# Patient Record
Sex: Male | Born: 1935 | Race: White | Hispanic: No | Smoking: Never smoker
Health system: Southern US, Community
[De-identification: ages and names within clinical notes are randomized; demographics above are authoritative.]

## PROBLEM LIST (undated history)

## (undated) DIAGNOSIS — I4891 Unspecified atrial fibrillation: Secondary | ICD-10-CM

## (undated) DIAGNOSIS — G629 Polyneuropathy, unspecified: Secondary | ICD-10-CM

## (undated) DIAGNOSIS — I509 Heart failure, unspecified: Secondary | ICD-10-CM

## (undated) HISTORY — PX: EYE SURGERY: SHX253

## (undated) HISTORY — PX: APPENDECTOMY: SHX54

## (undated) HISTORY — PX: BACK SURGERY: SHX140

## (undated) HISTORY — PX: PROSTATE SURGERY: SHX751

## (undated) HISTORY — PX: NASAL RECONSTRUCTION: SHX2069

---

## 2012-12-07 ENCOUNTER — Emergency Department (HOSPITAL_BASED_OUTPATIENT_CLINIC_OR_DEPARTMENT_OTHER): Payer: Medicare Other

## 2012-12-07 ENCOUNTER — Emergency Department (HOSPITAL_BASED_OUTPATIENT_CLINIC_OR_DEPARTMENT_OTHER)
Admission: EM | Admit: 2012-12-07 | Discharge: 2012-12-07 | Disposition: A | Discharge status: Home / Self Care | Payer: Medicare Other | Attending: Emergency Medicine | Admitting: Emergency Medicine

## 2012-12-07 ENCOUNTER — Encounter (HOSPITAL_BASED_OUTPATIENT_CLINIC_OR_DEPARTMENT_OTHER): Payer: Self-pay | Admitting: *Deleted

## 2012-12-07 DIAGNOSIS — W010XXA Fall on same level from slipping, tripping and stumbling without subsequent striking against object, initial encounter: Secondary | ICD-10-CM | POA: Insufficient documentation

## 2012-12-07 DIAGNOSIS — S46909A Unspecified injury of unspecified muscle, fascia and tendon at shoulder and upper arm level, unspecified arm, initial encounter: Secondary | ICD-10-CM | POA: Insufficient documentation

## 2012-12-07 DIAGNOSIS — S5010XA Contusion of unspecified forearm, initial encounter: Secondary | ICD-10-CM | POA: Insufficient documentation

## 2012-12-07 DIAGNOSIS — Z79899 Other long term (current) drug therapy: Secondary | ICD-10-CM | POA: Insufficient documentation

## 2012-12-07 DIAGNOSIS — Y9389 Activity, other specified: Secondary | ICD-10-CM | POA: Insufficient documentation

## 2012-12-07 DIAGNOSIS — T07XXXA Unspecified multiple injuries, initial encounter: Secondary | ICD-10-CM

## 2012-12-07 DIAGNOSIS — Z7982 Long term (current) use of aspirin: Secondary | ICD-10-CM | POA: Insufficient documentation

## 2012-12-07 DIAGNOSIS — S5000XA Contusion of unspecified elbow, initial encounter: Secondary | ICD-10-CM | POA: Insufficient documentation

## 2012-12-07 DIAGNOSIS — I4891 Unspecified atrial fibrillation: Secondary | ICD-10-CM | POA: Insufficient documentation

## 2012-12-07 DIAGNOSIS — Z8669 Personal history of other diseases of the nervous system and sense organs: Secondary | ICD-10-CM | POA: Insufficient documentation

## 2012-12-07 DIAGNOSIS — S4980XA Other specified injuries of shoulder and upper arm, unspecified arm, initial encounter: Secondary | ICD-10-CM | POA: Insufficient documentation

## 2012-12-07 DIAGNOSIS — W19XXXA Unspecified fall, initial encounter: Secondary | ICD-10-CM

## 2012-12-07 DIAGNOSIS — Y929 Unspecified place or not applicable: Secondary | ICD-10-CM | POA: Insufficient documentation

## 2012-12-07 DIAGNOSIS — S41111A Laceration without foreign body of right upper arm, initial encounter: Secondary | ICD-10-CM

## 2012-12-07 DIAGNOSIS — S8990XA Unspecified injury of unspecified lower leg, initial encounter: Secondary | ICD-10-CM | POA: Insufficient documentation

## 2012-12-07 HISTORY — DX: Unspecified atrial fibrillation: I48.91

## 2012-12-07 MED ORDER — BACITRACIN 500 UNIT/GM EX OINT
1.0000 "application " | TOPICAL_OINTMENT | Freq: Once | CUTANEOUS | Status: AC
  Administered 2012-12-07: 1 via TOPICAL
  Filled 2012-12-07: qty 0.9

## 2012-12-07 NOTE — ED Provider Notes (Signed)
TIME SEEN: 12:11 PM  CHIEF COMPLAINT: Fall, arm pain, foot pain  HPI: Patient is a 77 y.o. male with a history of atrial fibrillation only on aspirin and peripheral neuropathy presents the emergency department for evaluation for right arm and right foot pain and after a fall last night at approximately 10 PM. Patient reports that due to his neuropathy he has difficulty at times walking. He reports that he stubbed his right second toe and it caused him to stumble and he fell into the TV tray hitting his right arm. He landed on the right side of his body. He did not hit his head. No loss of consciousness. Denies any chest pain, shortness of breath, palpitations or dizziness that caused him to fall. No recent infectious symptoms. He is not on any other anticoagulation other than aspirin. Denies any numbness, tingling or focal weakness. He reports his last tetanus vaccination was in the past 5 years.  ROS: See HPI Constitutional: no fever  Eyes: no drainage  ENT: no runny nose   Cardiovascular:  no chest pain  Resp: no SOB  GI: no vomiting GU: no dysuria Integumentary: no rash  Allergy: no hives  Musculoskeletal: no leg swelling  Neurological: no slurred speech ROS otherwise negative  PAST MEDICAL HISTORY/PAST SURGICAL HISTORY:  Past Medical History  Diagnosis Date  . A-fib     MEDICATIONS:  Prior to Admission medications   Medication Sig Start Date End Date Taking? Authorizing Provider  acetaminophen (TYLENOL) 500 MG tablet Take 500 mg by mouth 2 (two) times daily.   Yes Historical Provider, MD  aspirin 325 MG tablet Take 325 mg by mouth daily.   Yes Historical Provider, MD  ibuprofen (ADVIL,MOTRIN) 600 MG tablet Take 600 mg by mouth every 8 (eight) hours as needed for pain.   Yes Historical Provider, MD  sertraline (ZOLOFT) 50 MG tablet Take 50 mg by mouth daily.   Yes Historical Provider, MD    ALLERGIES:  No Known Allergies  SOCIAL HISTORY:  History  Substance Use Topics  .  Smoking status: Never Smoker   . Smokeless tobacco: Not on file  . Alcohol Use: 1.2 oz/week    2 Glasses of wine per week     Comment: daily    FAMILY HISTORY: No family history on file.  EXAM: BP 144/64  Temp(Src) 97.8 F (36.6 C) (Oral)  Resp 20  Ht 5\' 11"  (1.803 m)  Wt 155 lb (70.308 kg)  BMI 21.63 kg/m2  SpO2 100% CONSTITUTIONAL: Alert and oriented and responds appropriately to questions. Well-appearing; well-nourished; GCS 15 HEAD: Normocephalic; atraumatic EYES: Conjunctivae clear, PERRL, EOMI ENT: normal nose; no rhinorrhea; moist mucous membranes; pharynx without lesions noted; no dental injury; no septal hematoma NECK: Supple, no meningismus, no LAD; no midline spinal tenderness, step-off or deformity CARD: RRR; S1 and S2 appreciated; no murmurs, no clicks, no rubs, no gallops RESP: Normal chest excursion without splinting or tachypnea; breath sounds clear and equal bilaterally; no wheezes, no rhonchi, no rales; chest wall stable, nontender to palpation ABD/GI: Normal bowel sounds; non-distended; soft, non-tender, no rebound, no guarding PELVIS:  stable, nontender to palpation BACK:  The back appears normal and is non-tender to palpation, there is no CVA tenderness; no midline spinal tenderness, step-off or deformity EXT: Ecchymosis to the right forearm and right elbow with no joint effusion, no bony tenderness or deformity, full range of motion in his shoulder, elbow, wrist on the right side, 2+ radial pulses bilaterally, neurovascularly intact distally, she  also has a 1 cm lesion to his second right toe that he reports his for several weeks, he also has mild swelling and bruising to his first and second toe of his right foot from his fall last night, 2+ DP pulses bilaterally, sensation to light touch intact diffusely, Normal ROM in all joints; otherwise non-tender to palpation; no edema; normal capillary refill; no cyanosis    SKIN: Normal color for age and race; warm,  multiple skin tears to his right forearm NEURO: Moves all extremities equally, sensation to light touch intact diffusely, cranial nerves II through XII intact, normal gait PSYCH: The patient's mood and manner are appropriate. Grooming and personal hygiene are appropriate.  MEDICAL DECISION MAKING: Patient with mechanical fall last night with right arm injury and right foot injury. Will obtain x-rays. Given his lacerations were sustained greater than 12 hours ago, I do not feel he needs repair at this time as the risk of infection is high. Discussed this with patient and wife who agree. Will clean wound, apply bacitracin and placed sterile dressing.  ED PROGRESS: X-rays show no acute abnormality. Patient has degenerative changes. Wound has been cleaned and dressed. Given instructions for supportive care, wound care, return precautions. Patient and wife verbalize understanding and are comfortable with plan.     [place], DO 12/07/12 1333

## 2012-12-07 NOTE — ED Notes (Signed)
Patient states last night at 2200 he fell while getting up from a chair.  Wife states he had to be assisted up from the floor by EMS.  States he has a skin tear to his right forearm,

## 2015-11-12 ENCOUNTER — Encounter (HOSPITAL_BASED_OUTPATIENT_CLINIC_OR_DEPARTMENT_OTHER): Payer: Self-pay | Admitting: *Deleted

## 2015-11-12 ENCOUNTER — Emergency Department (HOSPITAL_BASED_OUTPATIENT_CLINIC_OR_DEPARTMENT_OTHER)
Admission: EM | Admit: 2015-11-12 | Discharge: 2015-11-12 | Disposition: A | Discharge status: Home / Self Care | Payer: Medicare HMO | Attending: Emergency Medicine | Admitting: Emergency Medicine

## 2015-11-12 DIAGNOSIS — Y999 Unspecified external cause status: Secondary | ICD-10-CM | POA: Insufficient documentation

## 2015-11-12 DIAGNOSIS — Y9301 Activity, walking, marching and hiking: Secondary | ICD-10-CM | POA: Diagnosis not present

## 2015-11-12 DIAGNOSIS — W1809XA Striking against other object with subsequent fall, initial encounter: Secondary | ICD-10-CM | POA: Insufficient documentation

## 2015-11-12 DIAGNOSIS — S81812A Laceration without foreign body, left lower leg, initial encounter: Secondary | ICD-10-CM

## 2015-11-12 DIAGNOSIS — Y929 Unspecified place or not applicable: Secondary | ICD-10-CM | POA: Insufficient documentation

## 2015-11-12 HISTORY — DX: Polyneuropathy, unspecified: G62.9

## 2015-11-12 MED ORDER — LIDOCAINE-EPINEPHRINE 2 %-1:100000 IJ SOLN
10.0000 mL | Freq: Once | INTRAMUSCULAR | Status: AC
  Administered 2015-11-12: 10 mL
  Filled 2015-11-12: qty 1

## 2015-11-12 NOTE — ED Triage Notes (Signed)
Per EMS:  Pt from the gym.  States that he fell off the back of the treadmill.  Denies hitting head or LOC.  Laceration to left shin.  Bleeding controlled with dressing from EMS

## 2015-11-12 NOTE — ED Provider Notes (Signed)
[facility] Provider Note   CSN: [id number] Arrival date & time: 11/12/15  1232     History   Chief Complaint Chief Complaint  Patient presents with  . Fall    HPI Gabriel Ayala is a 80 y.o. male.  The history is provided by the patient.  Laceration   The incident occurred 1 to 2 hours ago. The laceration is located on the left leg. The laceration is 6 cm in size. The laceration mechanism was a a blunt object (on the treadmill and increased the speed causing him to fall and hit his leg). The pain is at a severity of 0/10. The patient is experiencing no pain. He reports no foreign bodies present. His tetanus status is UTD.    Past Medical History:  Diagnosis Date  . A-fib ([place])   . Neuropathy ([place])     There are no active problems to display for this patient.   Past Surgical History:  Procedure Laterality Date  . APPENDECTOMY    . BACK SURGERY    . EYE SURGERY     detached retina  . NASAL RECONSTRUCTION    . PROSTATE SURGERY         Home Medications    Prior to Admission medications   Medication Sig Start Date End Date Taking? Authorizing Provider  acetaminophen (TYLENOL) 500 MG tablet Take 500 mg by mouth 2 (two) times daily.    Historical Provider, MD  aspirin 325 MG tablet Take 325 mg by mouth daily.    Historical Provider, MD  sertraline (ZOLOFT) 50 MG tablet Take 50 mg by mouth daily.    Historical Provider, MD    Family History History reviewed. No pertinent family history.  Social History Social History  Substance Use Topics  . Smoking status: Never Smoker  . Smokeless tobacco: Not on file  . Alcohol use 1.2 oz/week    2 Glasses of wine per week     Comment: daily     Allergies   Review of patient's allergies indicates no known allergies.   Review of Systems Review of Systems  All other systems reviewed and are negative.    Physical Exam Updated Vital Signs BP 142/62 (BP Location: Left Arm)   Pulse 69   Temp 98.1  F (36.7 C) (Oral)   Resp 18   Ht 5\' 11"  (1.803 m)   Wt 160 lb (72.6 kg)   SpO2 99%   BMI 22.32 kg/m   Physical Exam  Constitutional: He appears well-developed and well-nourished. No distress.  HENT:  Head: Normocephalic and atraumatic.  Eyes: EOM are normal. Pupils are equal, round, and reactive to light.  Cardiovascular: Normal rate.   Pulmonary/Chest: Effort normal.  Musculoskeletal:       Legs: Skin: Skin is warm and dry. Capillary refill takes less than 2 seconds.  Psychiatric: He has a normal mood and affect. His behavior is normal.     ED Treatments / Results  Labs (all labs ordered are listed, but only abnormal results are displayed) Labs Reviewed - No data to display  EKG  EKG Interpretation None       Radiology No results found.  Procedures Procedures (including critical care time)  Medications Ordered in ED Medications  lidocaine-EPINEPHrine (XYLOCAINE W/EPI) 2 %-1:100000 (with pres) injection 10 mL (10 mLs Infiltration Given 11/12/15 1258)     Initial Impression / Assessment and Plan / ED Course  I have reviewed the triage vital signs and the nursing notes.  Pertinent labs & imaging results that were available during my care of the patient were reviewed by me and considered in my medical decision making (see chart for details).  Clinical Course    Patient is a 80 year old male who had a mechanical fall while walking on the treadmill today when he increased the speed to much. Laceration to the left shin without other complicating features. Patient denies a complete fall or hitting his head with loss of consciousness. Tetanus shot is up-to-date. Wound repaired as below.  LACERATION REPAIR Performed by: Blanchie Dessert Authorized byBlanchie Dessert Consent: Verbal consent obtained. Risks and benefits: risks, benefits and alternatives were discussed Consent given by: patient Patient identity confirmed: provided demographic data Prepped and  Draped in normal sterile fashion Wound explored  Laceration Location: left shin  Laceration Length: 6cm  No Foreign Bodies seen or palpated  Anesthesia: local infiltration  Local anesthetic: lidocaine 2% with epinephrine  Anesthetic total: 4 ml  Irrigation method: syringe Amount of cleaning: standard  Skin closure: staples  Number of sutures: 7  Technique: staples  Patient tolerance: Patient tolerated the procedure well with no immediate complications.   Final Clinical Impressions(s) / ED Diagnoses   Final diagnoses:  Laceration of left lower leg, initial encounter    New Prescriptions Discharge Medication List as of 11/12/2015  1:46 PM       Blanchie Dessert, MD 11/12/15 1622

## 2015-11-12 NOTE — ED Triage Notes (Addendum)
Per EMS report: EMS transport from [facility]. Fell while walking on treadmill. Laceration present to left leg with dressing in place. No LOC. Pt cao x 4.

## 2016-01-31 ENCOUNTER — Encounter (HOSPITAL_BASED_OUTPATIENT_CLINIC_OR_DEPARTMENT_OTHER): Payer: Self-pay | Admitting: Adult Health

## 2016-01-31 ENCOUNTER — Emergency Department (HOSPITAL_BASED_OUTPATIENT_CLINIC_OR_DEPARTMENT_OTHER)
Admission: EM | Admit: 2016-01-31 | Discharge: 2016-01-31 | Disposition: A | Discharge status: Home / Self Care | Payer: Medicare HMO | Attending: Emergency Medicine | Admitting: Emergency Medicine

## 2016-01-31 DIAGNOSIS — Y929 Unspecified place or not applicable: Secondary | ICD-10-CM | POA: Insufficient documentation

## 2016-01-31 DIAGNOSIS — W5589XA Other contact with other mammals, initial encounter: Secondary | ICD-10-CM | POA: Diagnosis not present

## 2016-01-31 DIAGNOSIS — Y999 Unspecified external cause status: Secondary | ICD-10-CM | POA: Diagnosis not present

## 2016-01-31 DIAGNOSIS — Z7982 Long term (current) use of aspirin: Secondary | ICD-10-CM | POA: Insufficient documentation

## 2016-01-31 DIAGNOSIS — S81811A Laceration without foreign body, right lower leg, initial encounter: Secondary | ICD-10-CM | POA: Insufficient documentation

## 2016-01-31 DIAGNOSIS — Y939 Activity, unspecified: Secondary | ICD-10-CM | POA: Diagnosis not present

## 2016-01-31 MED ORDER — CEPHALEXIN 500 MG PO CAPS: 500.0000 mg | ORAL_CAPSULE | Freq: Two times a day (BID) | ORAL | 0 refills | Status: DC

## 2016-01-31 MED ORDER — BACITRACIN ZINC 500 UNIT/GM EX OINT
TOPICAL_OINTMENT | Freq: Two times a day (BID) | CUTANEOUS | Status: DC
  Administered 2016-01-31: 1 via TOPICAL

## 2016-01-31 MED ORDER — LIDOCAINE-EPINEPHRINE (PF) 2 %-1:200000 IJ SOLN: 10.0000 mL | Freq: Once | INTRAMUSCULAR | Status: DC

## 2016-01-31 MED ORDER — LIDOCAINE-EPINEPHRINE 1 %-1:200000 IJ SOLN
INTRAMUSCULAR | Status: AC
Start: 2016-01-31 — End: 2016-01-31
  Administered 2016-01-31: 30 mL
  Filled 2016-01-31: qty 30

## 2016-01-31 NOTE — Discharge Instructions (Signed)
Bacitracin or neosporin twice a day. Keep wound clean and dry. Cover with dry dressing. Take keflex to prevent infection. Keep close eye on signs of infection and follow up as needed. Follow up in 10-14 days for suture removal.

## 2016-01-31 NOTE — ED Triage Notes (Signed)
Presents with laceration to right lateral lower leg from a dog scratch. Bleeding slightly. CMS intact.

## 2016-01-31 NOTE — ED Notes (Signed)
Dressing applied and patient d/c'd home

## 2016-01-31 NOTE — ED Provider Notes (Signed)
[facility] Provider Note   CSN: [id number] Arrival date & time: 01/31/16  1242     History   Chief Complaint Chief Complaint  Patient presents with  . Laceration    HPI OZIL STETTLER is a 80 y.o. male.  HPI DONOVAN PERSLEY is a 80 y.o. male with history of A. fib and peripheral neuropathy, presents to emergency department with complaint of a laceration. Patient states that his dog jumped on him yesterday evening and he sustained a laceration to the right lower leg. Patient states that he apply dressing to the leg yesterday, but it has continued to bleed last night and all through the night into this morning so he decided to come have it looked at. Patient denies any fever or chills. He does not take any blood thinners. He denies any numbness or weakness distal to the injury. States tetanus is up to date  Past Medical History:  Diagnosis Date  . A-fib ([place])   . Neuropathy ([place])     There are no active problems to display for this patient.   Past Surgical History:  Procedure Laterality Date  . APPENDECTOMY    . BACK SURGERY    . EYE SURGERY     detached retina  . NASAL RECONSTRUCTION    . PROSTATE SURGERY         Home Medications    Prior to Admission medications   Medication Sig Start Date End Date Taking? Authorizing Provider  acetaminophen (TYLENOL) 500 MG tablet Take 500 mg by mouth 2 (two) times daily.    Historical Provider, MD  aspirin 325 MG tablet Take 325 mg by mouth daily.    Historical Provider, MD  sertraline (ZOLOFT) 50 MG tablet Take 50 mg by mouth daily.    Historical Provider, MD    Family History History reviewed. No pertinent family history.  Social History Social History  Substance Use Topics  . Smoking status: Never Smoker  . Smokeless tobacco: Not on file  . Alcohol use 1.2 oz/week    2 Glasses of wine per week     Comment: daily     Allergies   Patient has no known allergies.   Review of Systems Review of  Systems  Constitutional: Negative for chills and fever.  Skin: Positive for wound.  Neurological: Negative for weakness and numbness.     Physical Exam Updated Vital Signs BP 131/56 (BP Location: Right Arm)   Pulse 70   Temp 97.8 F (36.6 C) (Oral)   Resp 18   Ht 5\' 11"  (1.803 m)   Wt 77.1 kg   SpO2 99%   BMI 23.71 kg/m   Physical Exam  Constitutional: He is oriented to person, place, and time. He appears well-developed and well-nourished. No distress.  Eyes: Conjunctivae are normal.  Neck: Neck supple.  Cardiovascular: Normal rate.   Pulmonary/Chest: No respiratory distress.  Abdominal: He exhibits no distension.  Musculoskeletal:  8 cm deep  flap laceration to the right lower leg. There is active slow bleeding from superficial varicosities. DP pulses intact. Full ROM of the foot.  Neurological: He is alert and oriented to person, place, and time.  Skin: Skin is warm and dry. Capillary refill takes less than 2 seconds.  Nursing note and vitals reviewed.    ED Treatments / Results  Labs (all labs ordered are listed, but only abnormal results are displayed) Labs Reviewed - No data to display  EKG  EKG Interpretation None  Radiology No results found.  Procedures Procedures (including critical care time)  LACERATION REPAIR Performed by: Renold Genta Authorized by: Jeannett Senior A Consent: Verbal consent obtained. Risks and benefits: risks, benefits and alternatives were discussed Consent given by: patient Patient identity confirmed: provided demographic data Prepped and Draped in normal sterile fashion Wound explored  Laceration Location: right lower leg  Laceration Length: 8cm  No Foreign Bodies seen or palpated  Anesthesia: local infiltration  Local anesthetic: lidocaine 2% w epinephrine  Anesthetic total: 4 ml  Irrigation method: syringe Amount of cleaning: extensive  Skin closure: prolene 4.0  Number of sutures:  7  Technique: simple interrupted  Patient tolerance: Patient tolerated the procedure well with no immediate complications.  Medications Ordered in ED Medications  lidocaine-EPINEPHrine (XYLOCAINE W/EPI) 2 %-1:200000 (PF) injection 10 mL (not administered)  lidocaine-EPINEPHrine (XYLOCAINE-EPINEPHrine) 1 %-1:200000 (PF) injection (not administered)     Initial Impression / Assessment and Plan / ED Course  I have reviewed the triage vital signs and the nursing notes.  Pertinent labs & imaging results that were available during my care of the patient were reviewed by me and considered in my medical decision making (see chart for details).  Clinical Course    Patient with a wound to the right lower leg after a dog scratch yesterday. Here because he continues to bleed. Bleeding looks like coming from lacerated varicose vein. After numbing the wound, it was extensively irrigated. Wound is closely approximated with stitches. Bleeding stopped. His tetanus is up today. Will discharge home with Keflex for infection prophylaxis and follow-up.  Vitals:   01/31/16 1250 01/31/16 1252  BP: 131/56   Pulse: 70   Resp: 18   Temp: 97.8 F (36.6 C)   TempSrc: Oral   SpO2: 99%   Weight:  77.1 kg  Height:  5\' 11"  (1.803 m)     Final Clinical Impressions(s) / ED Diagnoses   Final diagnoses:  Laceration of right lower leg, initial encounter    New Prescriptions Discharge Medication List as of 01/31/2016  1:33 PM    START taking these medications   Details  cephALEXin (KEFLEX) 500 MG capsule Take 1 capsule (500 mg total) by mouth 2 (two) times daily., Starting Thu 01/31/2016, Print         Jeannett Senior, PA-C 01/31/16 [place], MD 02/01/16 [phone]

## 2017-08-24 ENCOUNTER — Ambulatory Visit: Payer: Medicare HMO | Admitting: Physical Therapy

## 2018-08-03 ENCOUNTER — Encounter: Payer: Self-pay | Admitting: Internal Medicine

## 2018-08-03 ENCOUNTER — Non-Acute Institutional Stay (SKILLED_NURSING_FACILITY): Payer: Medicare HMO | Admitting: Internal Medicine

## 2018-08-03 DIAGNOSIS — M4800 Spinal stenosis, site unspecified: Secondary | ICD-10-CM | POA: Diagnosis not present

## 2018-08-03 DIAGNOSIS — I480 Paroxysmal atrial fibrillation: Secondary | ICD-10-CM

## 2018-08-03 DIAGNOSIS — G63 Polyneuropathy in diseases classified elsewhere: Secondary | ICD-10-CM

## 2018-08-03 DIAGNOSIS — L2089 Other atopic dermatitis: Secondary | ICD-10-CM

## 2018-08-03 DIAGNOSIS — I10 Essential (primary) hypertension: Secondary | ICD-10-CM | POA: Diagnosis not present

## 2018-08-03 DIAGNOSIS — R296 Repeated falls: Secondary | ICD-10-CM

## 2018-08-03 DIAGNOSIS — E559 Vitamin D deficiency, unspecified: Secondary | ICD-10-CM

## 2018-08-03 DIAGNOSIS — R339 Retention of urine, unspecified: Secondary | ICD-10-CM

## 2018-08-03 DIAGNOSIS — K5904 Chronic idiopathic constipation: Secondary | ICD-10-CM

## 2018-08-03 NOTE — Progress Notes (Signed)
: Provider:  Inocencio Homes MD Location:  [place] Room Number: 505/P Place of Service:  SNF [phone])  PCP: Lowanda Foster, MD Patient Care Team: Lowanda Foster, MD as PCP - General (Internal Medicine)  Extended Emergency Contact Information Primary Emergency Contact: Olguin,Gloria T Address: [address] [place], [place] [address] [place] Phone: [phone] Relation: Spouse     Allergies: Patient has no known allergies.  Chief Complaint  Patient presents with  . New Admit To SNF    New Admission Visit    HPI: Patient is 83 y.o. male with paracentral fib on apixaban, hypertension, and chronic eczema who presented to [facility] ED after a fall 3 days prior.  Patient had been walking with a rolling walker hit a hose and he flipped and fell down on his right hip and had not been able to get up and walk since.  Work-up in the ED with x-ray as well as CT of the right hip and femur was negative for fracture.  No visible bleeding.  Patient was admitted to [facility] from 5/19-23 for pain control as well as PT evaluation, possible SNF placement.  Patient was evaluated for his impaired balance with a history of spinal stenosis that was felt to be have exacerbated patient is ambulatory and gait status all other problems being controlled patient is admitted to skilled nursing facility for OT/PT.  While at skilled nursing facility patient will be followed for paroxysmal atrial fibrillation treated with Eliquis, spinal stenosis treated with Neurontin and atopic dermatitis treated with crisaborole ointment Elocon, and Aldara cream, vitamin D deficiency treated with replacement  Past Medical History:  Diagnosis Date  . A-fib ([place])   . Neuropathy     Past Surgical History:  Procedure Laterality Date  . APPENDECTOMY    . BACK SURGERY    . EYE SURGERY     detached retina  . NASAL  RECONSTRUCTION    . PROSTATE SURGERY      Allergies as of 08/03/2018   No Known Allergies     Medication List       Accurate as of Aug 03, 2018 11:59 AM. If you have any questions, ask your nurse or doctor.        STOP taking these medications   cephALEXin 500 MG capsule Commonly known as:  KEFLEX Stopped by:  Inocencio Homes, MD     TAKE these medications   acetaminophen 500 MG tablet Commonly known as:  TYLENOL Take 500 mg by mouth 2 (two) times daily.   allopurinol 100 MG tablet Commonly known as:  ZYLOPRIM Take 100 mg by mouth daily.   ARTIFICIAL TEARS OP Place 1 drop into both eyes daily.   aspirin 81 MG chewable tablet Chew 81 mg by mouth daily. What changed:  Another medication with the same name was removed. Continue taking this medication, and follow the directions you see here. Changed by:  Inocencio Homes, MD   BISACODYL LAXATIVE RE If not relieved by MOM, give 10 mg Bisacodyl suppositiory rectally X 1 dose in 24 hours as needed (Do not use constipation standing orders for residents with renal failure/CFR less than 30. Contact MD for orders) (Physician Order)   [employer] CALCIUM-600/VIT D PO Give 1 tablet by mouth twice a day   doxepin 100 MG capsule Commonly known as:  SINEQUAN 100 mg. Give 1  capsule by mouth nightly   Eliquis 2.5 MG Tabs tablet Generic drug:  apixaban Take 2.5 mg by mouth 2 (two) times daily.   Eucrisa 2 % Oint Generic drug:  Crisaborole Apply topically twice a day for severe pruritus and atopic dermatitis   furosemide 20 MG tablet Commonly known as:  LASIX Take 20 mg by mouth daily.   gabapentin 100 MG capsule Commonly known as:  NEURONTIN Take 100 mg by mouth 3 (three) times daily.   hydrocortisone 2.5 % cream Apply to scalp, face, ears and neck daily   ICAPS AREDS 2 PO Take by mouth. Give 1 capsule by mouth every evening   MULTIVITAMIN ADULTS PO Give 1 tablet by mouth daily   imiquimod 5 % cream Commonly known as:   ALDARA Apply to affected area three times per week for severe pruritus and atopic dermatitis   Magnesium 400 MG Tabs Give 1 tablet by mouth daily   magnesium hydroxide 400 MG/5ML suspension Commonly known as:  MILK OF MAGNESIA If no BM in 3 days, give 30 cc Milk of Magnesium p.o. x 1 dose in 24 hours as needed (Do not use standing constipation orders for residents with renal failure CFR less than 30. Contact MD for orders) (Physician Order)   Melatonin 3 MG Tabs Give 1 tablet by mouth nightly   mirtazapine 15 MG tablet Commonly known as:  REMERON Take 15 mg by mouth at bedtime.   mometasone 0.1 % cream Commonly known as:  ELOCON Apply on itchy areas of body 2 times a day for Severe Pruritus   omega-3 acid ethyl esters 1 g capsule Commonly known as:  LOVAZA Give 1 capsule by mouth twice a day   OMEPRAZOLE PO 20 MG CAPSULE: Give 1 capsule by mouth daily   RA SALINE ENEMA RE If not relieved by Biscodyl suppository, give disposable Saline Enema rectally X 1 dose/24 hrs as needed (Do not use constipation standing orders for residents with renal failure/CFR less than 30. Contact MD for orders)(Physician Or   sertraline 25 MG tablet Commonly known as:  ZOLOFT Give 1 tablet by mouth daily for chronic pain and mood What changed:  Another medication with the same name was removed. Continue taking this medication, and follow the directions you see here. Changed by:  Inocencio Homes, MD   triamcinolone cream 0.1 % Commonly known as:  KENALOG Apply to rough areas of body daily as needed   VITAMIN D (ERGOCALCIFEROL) PO 1,000 UNIT TABLET: Give 1 tablet by mouth every evening       No orders of the defined types were placed in this encounter.    There is no immunization history on file for this patient.  Social History   Tobacco Use  . Smoking status: Never Smoker  . Smokeless tobacco: Never Used  Substance Use Topics  . Alcohol use: Yes    Alcohol/week: 2.0 standard drinks     Types: 2 Glasses of wine per week    Comment: daily    Family history is dad had lung cancer, maternal grandfather angina.  No family history on file.    Review of Systems  DATA OBTAINED: from patient, nurse GENERAL:  no fevers, fatigue, appetite changes SKIN: No itching, or rash EYES: No eye pain, redness, discharge EARS: No earache, tinnitus, change in hearing NOSE: No congestion, drainage or bleeding  MOUTH/THROAT: No mouth or tooth pain, No sore throat RESPIRATORY: No cough, wheezing, SOB CARDIAC: No chest pain, palpitations, lower extremity edema  GI: No abdominal pain, No N/V/D: +constipation, No heartburn or reflux  GU: No dysuria, frequency or urgency, or incontinence  MUSCULOSKELETAL: No unrelieved bone/joint pain NEUROLOGIC: No headache, dizziness or focal weakness; some pain in his feet, normally treated with gabapentin PSYCHIATRIC: No c/o anxiety or sadness   Vitals:   08/03/18 1120  BP: 106/69  Pulse: 69  Temp: (!) 97.5 F (36.4 C)    SpO2 Readings from Last 1 Encounters:  01/31/16 99%   There is no height or weight on file to calculate BMI.     Physical Exam  GENERAL APPEARANCE: Alert, conversant,  No acute distress.  SKIN: No diaphoresis rash; and some minimal scale to arms hands HEAD: Normocephalic, atraumatic  EYES: Conjunctiva/lids clear. Pupils round, reactive. EOMs intact.  EARS: External exam WNL, canals clear. Hearing grossly normal.  NOSE: No deformity or discharge.  MOUTH/THROAT: Lips w/o lesions  RESPIRATORY: Breathing is even, unlabored. Lung sounds are clear   CARDIOVASCULAR: Heart RRR no murmurs, rubs or gallops. No peripheral edema.   GASTROINTESTINAL: Abdomen is soft, non-tender, not distended w/ normal bowel sounds. GENITOURINARY: Bladder non tender, not distended  MUSCULOSKELETAL: No abnormal joints or musculature NEUROLOGIC:  Cranial nerves 2-12 grossly intact. Moves all extremities  PSYCHIATRIC: Mood and affect appropriate  to situation, no behavioral issues  There are no active problems to display for this patient.     Labs reviewed: Basic Metabolic Panel: No results found for: NA, K, CL, CO2, GLUCOSE, BUN, CREATININE, CALCIUM, PROT, ALBUMIN, AST, ALT, ALKPHOS, BILITOT, GFRNONAA, GFRAA  No results for input(s): NA, K, CL, CO2, GLUCOSE, BUN, CREATININE, CALCIUM, MG, PHOS in the last 8760 hours. Liver Function Tests: No results for input(s): AST, ALT, ALKPHOS, BILITOT, PROT, ALBUMIN in the last 8760 hours. No results for input(s): LIPASE, AMYLASE in the last 8760 hours. No results for input(s): AMMONIA in the last 8760 hours. CBC: No results for input(s): WBC, NEUTROABS, HGB, HCT, MCV, PLT in the last 8760 hours. Lipid No results for input(s): CHOL, HDL, LDLCALC, TRIG in the last 8760 hours.  Cardiac Enzymes: No results for input(s): CKTOTAL, CKMB, CKMBINDEX, TROPONINI in the last 8760 hours. BNP: No results for input(s): BNP in the last 8760 hours. No results found for: MICROALBUR No results found for: HGBA1C No results found for: TSH No results found for: VITAMINB12 No results found for: FOLATE No results found for: IRON, TIBC, FERRITIN  Imaging and Procedures obtained prior to SNF admission: No results found.   Not all labs, radiology exams or other studies done during hospitalization come through on my EPIC note; however they are reviewed by me.    Assessment and Plan  Recurrent falls/spinal stenosis/right hip pain- patient with impaired balance and physical therapy at SNF recommended; suspect spinal stenosis and peripheral neuropathy exacerbated problem SNF- admitted for OT/PT; increase Neurontin from 203 times a day to 300 mg twice daily and 400 nightly  Hypertension-blood pressure was soft/low normal; lisinopril was discontinued and blood pressure improved SNF-; follow blood pressure meds; follow-up BMP  Paroxysmal atrial fibrillation SNF- patient not on meds for rate; continue  Eliquis 2.5 mg twice daily  Urinary retention- occurred 1 day with an residual of 300 cc SNF- will follow voiding  Constipation SNF patient was not sent out on anything for constipation; will start MiraLAX 17 g daily  Severe atopic dermatitis SNF- appears fairly well controlled on crisaborole 2% ointment twice daily, Elocon 0.1% twice daily, and hydrocortisone 2% cream applied to scalp face ears and neck  2 times daily, and Aldara apply 3 times weekly, along with triamcinolone 0.1% cream to apply to rough areas of body daily as needed  Vitamin D deficiency SNF- continue replacement 1000 units nightly    Time spent greater than 45 minutes;> 50% of time with patient was spent reviewing records, labs, tests and studies, counseling and developing plan of care

## 2018-08-04 LAB — COMPREHENSIVE METABOLIC PANEL
Calcium: 9.4 (ref 8.7–10.7)
GFR calc Af Amer: 67.8
GFR calc non Af Amer: 58.5

## 2018-08-04 LAB — BASIC METABOLIC PANEL
BUN: 25 — AB (ref 4–21)
CO2: 26 — AB (ref 13–22)
Chloride: 101 (ref 99–108)
Creatinine: 1.2 (ref 0.6–1.3)
Glucose: 71
Potassium: 4.4 (ref 3.4–5.3)
Sodium: 138 (ref 137–147)

## 2018-08-04 LAB — CBC: RBC: 2.96 — AB (ref 3.87–5.11)

## 2018-08-04 LAB — CBC AND DIFFERENTIAL
HCT: 30 — AB (ref 41–53)
Hemoglobin: 10 — AB (ref 13.5–17.5)
Platelets: 274 (ref 150–399)
WBC: 4.6

## 2018-08-06 ENCOUNTER — Non-Acute Institutional Stay (SKILLED_NURSING_FACILITY): Payer: Medicare HMO | Admitting: Internal Medicine

## 2018-08-06 DIAGNOSIS — R600 Localized edema: Secondary | ICD-10-CM | POA: Diagnosis not present

## 2018-08-06 DIAGNOSIS — R635 Abnormal weight gain: Secondary | ICD-10-CM | POA: Diagnosis not present

## 2018-08-07 ENCOUNTER — Encounter: Payer: Self-pay | Admitting: Internal Medicine

## 2018-08-07 DIAGNOSIS — G629 Polyneuropathy, unspecified: Secondary | ICD-10-CM | POA: Insufficient documentation

## 2018-08-07 DIAGNOSIS — K5900 Constipation, unspecified: Secondary | ICD-10-CM | POA: Insufficient documentation

## 2018-08-07 DIAGNOSIS — M4800 Spinal stenosis, site unspecified: Secondary | ICD-10-CM | POA: Insufficient documentation

## 2018-08-07 DIAGNOSIS — L209 Atopic dermatitis, unspecified: Secondary | ICD-10-CM | POA: Insufficient documentation

## 2018-08-07 DIAGNOSIS — I480 Paroxysmal atrial fibrillation: Secondary | ICD-10-CM | POA: Insufficient documentation

## 2018-08-07 DIAGNOSIS — R296 Repeated falls: Secondary | ICD-10-CM | POA: Insufficient documentation

## 2018-08-07 DIAGNOSIS — R339 Retention of urine, unspecified: Secondary | ICD-10-CM | POA: Insufficient documentation

## 2018-08-07 DIAGNOSIS — M5416 Radiculopathy, lumbar region: Secondary | ICD-10-CM | POA: Insufficient documentation

## 2018-08-07 DIAGNOSIS — M48061 Spinal stenosis, lumbar region without neurogenic claudication: Secondary | ICD-10-CM | POA: Insufficient documentation

## 2018-08-07 DIAGNOSIS — I10 Essential (primary) hypertension: Secondary | ICD-10-CM | POA: Insufficient documentation

## 2018-08-07 DIAGNOSIS — E559 Vitamin D deficiency, unspecified: Secondary | ICD-10-CM | POA: Insufficient documentation

## 2018-08-07 DIAGNOSIS — R338 Other retention of urine: Secondary | ICD-10-CM | POA: Insufficient documentation

## 2018-08-08 ENCOUNTER — Encounter: Payer: Self-pay | Admitting: Internal Medicine

## 2018-08-08 NOTE — Progress Notes (Signed)
Location:  [place] of Service:  SNF (31)SNF  Provider: Webb Silversmith d Sheppard Coil MD  Lowanda Foster, MD  Patient Care Team: Lowanda Foster, MD as PCP - General (Internal Medicine)  Extended Emergency Contact Information Primary Emergency Contact: Armitage,Gloria T Address: [address] [place], [place] [address] [place] Phone: [phone] Relation: Spouse    Allergies: Patient has no known allergies.  Chief Complaint  Patient presents with  . Acute Visit    HPI: Patient is 83 y.o. male who is being seen today because dietary is showing patient has gained 7 pounds in the past week patient denies chest pain or shortness of breath lower extremity edema has been noted  Past Medical History:  Diagnosis Date  . A-fib ([place])   . Neuropathy     Past Surgical History:  Procedure Laterality Date  . APPENDECTOMY    . BACK SURGERY    . EYE SURGERY     detached retina  . NASAL RECONSTRUCTION    . PROSTATE SURGERY      Allergies as of 08/06/2018   No Known Allergies     Medication List       Accurate as of Aug 06, 2018 11:59 PM. If you have any questions, ask your nurse or doctor.        acetaminophen 500 MG tablet Commonly known as:  TYLENOL Take 500 mg by mouth 2 (two) times daily.   allopurinol 100 MG tablet Commonly known as:  ZYLOPRIM Take 100 mg by mouth daily.   ARTIFICIAL TEARS OP Place 1 drop into both eyes daily.   aspirin 81 MG chewable tablet Chew 81 mg by mouth daily.   BISACODYL LAXATIVE RE If not relieved by MOM, give 10 mg Bisacodyl suppositiory rectally X 1 dose in 24 hours as needed (Do not use constipation standing orders for residents with renal failure/CFR less than 30. Contact MD for orders) (Physician Order)   [employer] CALCIUM-600/VIT D PO Give 1 tablet by mouth twice a day   doxepin 100 MG capsule Commonly known as:  SINEQUAN 100 mg. Give 1 capsule by mouth  nightly   Eliquis 2.5 MG Tabs tablet Generic drug:  apixaban Take 2.5 mg by mouth 2 (two) times daily.   Eucrisa 2 % Oint Generic drug:  Crisaborole Apply topically twice a day for severe pruritus and atopic dermatitis   furosemide 20 MG tablet Commonly known as:  LASIX Take 20 mg by mouth daily.   gabapentin 100 MG capsule Commonly known as:  NEURONTIN Take 100 mg by mouth 3 (three) times daily.   hydrocortisone 2.5 % cream Apply to scalp, face, ears and neck daily   ICAPS AREDS 2 PO Take by mouth. Give 1 capsule by mouth every evening   MULTIVITAMIN ADULTS PO Give 1 tablet by mouth daily   imiquimod 5 % cream Commonly known as:  ALDARA Apply to affected area three times per week for severe pruritus and atopic dermatitis   Magnesium 400 MG Tabs Give 1 tablet by mouth daily   magnesium hydroxide 400 MG/5ML suspension Commonly known as:  MILK OF MAGNESIA If no BM in 3 days, give 30 cc Milk of Magnesium p.o. x 1 dose in 24 hours as needed (Do not use standing constipation orders for residents with renal failure CFR less than 30. Contact MD for orders) (Physician Order)   Melatonin 3  MG Tabs Give 1 tablet by mouth nightly   mirtazapine 15 MG tablet Commonly known as:  REMERON Take 15 mg by mouth at bedtime.   mometasone 0.1 % cream Commonly known as:  ELOCON Apply on itchy areas of body 2 times a day for Severe Pruritus   omega-3 acid ethyl esters 1 g capsule Commonly known as:  LOVAZA Give 1 capsule by mouth twice a day   OMEPRAZOLE PO 20 MG CAPSULE: Give 1 capsule by mouth daily   RA SALINE ENEMA RE If not relieved by Biscodyl suppository, give disposable Saline Enema rectally X 1 dose/24 hrs as needed (Do not use constipation standing orders for residents with renal failure/CFR less than 30. Contact MD for orders)(Physician Or   sertraline 25 MG tablet Commonly known as:  ZOLOFT Give 1 tablet by mouth daily for chronic pain and mood   triamcinolone cream  0.1 % Commonly known as:  KENALOG Apply to rough areas of body daily as needed   VITAMIN D (ERGOCALCIFEROL) PO 1,000 UNIT TABLET: Give 1 tablet by mouth every evening       No orders of the defined types were placed in this encounter.    There is no immunization history on file for this patient.  Social History   Tobacco Use  . Smoking status: Never Smoker  . Smokeless tobacco: Never Used  Substance Use Topics  . Alcohol use: Yes    Alcohol/week: 2.0 standard drinks    Types: 2 Glasses of wine per week    Comment: daily    Review of Systems  DATA OBTAINED: from nurse GENERAL:  no fevers, fatigue, appetite changes SKIN: No itching, rash HEENT: No complaint RESPIRATORY: No cough, wheezing, SOB CARDIAC: No chest pain, palpitations, lower extremity edema  GI: No abdominal pain, No N/V/D or constipation, No heartburn or reflux  GU: No dysuria, frequency or urgency, or incontinence  MUSCULOSKELETAL: No unrelieved bone/joint pain NEUROLOGIC: No headache, dizziness  PSYCHIATRIC: No overt anxiety or sadness  Vitals:   08/08/18 2318  BP: (!) 150/70  Pulse: 75  Resp: 18  Temp: 98 F (36.7 C)   There is no height or weight on file to calculate BMI. Physical Exam  GENERAL APPEARANCE: Alert, conversant, No acute distress  SKIN: No diaphoresis rash HEENT: Unremarkable RESPIRATORY: Breathing is even, unlabored. Lung sounds are clear   CARDIOVASCULAR: Heart RRR no murmurs, rubs or gallops. 1+ lower remedy peripheral edema  GASTROINTESTINAL: Abdomen is soft, non-tender, not distended w/ normal bowel sounds.  GENITOURINARY: Bladder non tender, not distended  MUSCULOSKELETAL: No abnormal joints or musculature NEUROLOGIC: Cranial nerves 2-12 grossly intact. Moves all extremities PSYCHIATRIC: Mood and affect appropriate to situation, no behavioral issues  Patient Active Problem List   Diagnosis Date Noted  . Spinal stenosis 08/07/2018  . Peripheral neuropathy 08/07/2018   . Recurrent falls 08/07/2018  . Essential hypertension 08/07/2018  . Paroxysmal atrial fibrillation ([place]) 08/07/2018  . Urinary retention 08/07/2018  . Atopic eczema 08/07/2018  . Constipation 08/07/2018  . Vitamin D deficiency 08/07/2018    CMP  No results found for: NA, K, CL, CO2, GLUCOSE, BUN, CREATININE, CALCIUM, PROT, ALBUMIN, AST, ALT, ALKPHOS, BILITOT, GFRNONAA, GFRAA No results for input(s): NA, K, CL, CO2, GLUCOSE, BUN, CREATININE, CALCIUM, MG, PHOS in the last 8760 hours. No results for input(s): AST, ALT, ALKPHOS, BILITOT, PROT, ALBUMIN in the last 8760 hours. No results for input(s): WBC, NEUTROABS, HGB, HCT, MCV, PLT in the last 8760 hours. No results for input(s):  CHOL, LDLCALC, TRIG in the last 8760 hours.  Invalid input(s): HCL No results found for: MICROALBUR No results found for: TSH No results found for: HGBA1C No results found for: CHOL, HDL, LDLCALC, LDLDIRECT, TRIG, CHOLHDL  Significant Diagnostic Results in last 30 days:  No results found.  Assessment and Plan  Weight gain with edema- 7 pound weight gain in 1 week with lower extremity edema will include; will increase Lasix from 20 mg daily to 40 mg daily with repeat BMP on 6/ 1    Inocencio Homes, MD

## 2018-08-09 LAB — BASIC METABOLIC PANEL
BUN: 23 — AB (ref 4–21)
CO2: 27 — AB (ref 13–22)
Chloride: 102 (ref 99–108)
Creatinine: 1.2 (ref 0.6–1.3)
Glucose: 73
Potassium: 4.6 (ref 3.4–5.3)
Sodium: 140 (ref 137–147)

## 2018-08-09 LAB — COMPREHENSIVE METABOLIC PANEL
Calcium: 9.7 (ref 8.7–10.7)
GFR calc Af Amer: 67.09
GFR calc non Af Amer: 57.89

## 2018-08-10 LAB — BASIC METABOLIC PANEL
BUN: 25 — AB (ref 4–21)
CO2: 27 — AB (ref 13–22)
Chloride: 98 — AB (ref 99–108)
Creatinine: 1.2 (ref 0.6–1.3)
Glucose: 55
Potassium: 4.3 (ref 3.4–5.3)
Sodium: 138 (ref 137–147)

## 2018-08-10 LAB — COMPREHENSIVE METABOLIC PANEL
Calcium: 9.8 (ref 8.7–10.7)
GFR calc Af Amer: 63.78
GFR calc non Af Amer: 55.03

## 2018-08-13 ENCOUNTER — Encounter: Payer: Self-pay | Admitting: Adult Health

## 2018-08-13 ENCOUNTER — Non-Acute Institutional Stay (SKILLED_NURSING_FACILITY): Payer: Medicare HMO | Admitting: Adult Health

## 2018-08-13 ENCOUNTER — Other Ambulatory Visit: Payer: Self-pay | Admitting: Adult Health

## 2018-08-13 DIAGNOSIS — M4800 Spinal stenosis, site unspecified: Secondary | ICD-10-CM

## 2018-08-13 DIAGNOSIS — I480 Paroxysmal atrial fibrillation: Secondary | ICD-10-CM

## 2018-08-13 DIAGNOSIS — R296 Repeated falls: Secondary | ICD-10-CM

## 2018-08-13 MED ORDER — MOMETASONE FUROATE 0.1 % EX CREA: TOPICAL_CREAM | Freq: Two times a day (BID) | CUTANEOUS | 0 refills | Status: DC

## 2018-08-13 MED ORDER — TRIAMCINOLONE ACETONIDE 0.1 % EX CREA: TOPICAL_CREAM | Freq: Every day | CUTANEOUS | 0 refills | Status: DC

## 2018-08-13 MED ORDER — IMIQUIMOD 5 % EX CREA: TOPICAL_CREAM | CUTANEOUS | 0 refills | Status: DC

## 2018-08-13 MED ORDER — FUROSEMIDE 40 MG PO TABS: 40.0000 mg | ORAL_TABLET | Freq: Every day | ORAL | 0 refills | Status: DC

## 2018-08-13 MED ORDER — GABAPENTIN 100 MG PO CAPS: 200.0000 mg | ORAL_CAPSULE | Freq: Three times a day (TID) | ORAL | 0 refills | Status: DC

## 2018-08-13 MED ORDER — HYDROCORTISONE 2.5 % EX CREA: TOPICAL_CREAM | Freq: Every day | CUTANEOUS | 0 refills | Status: DC

## 2018-08-13 MED ORDER — OMEGA-3-ACID ETHYL ESTERS 1 G PO CAPS: 1.0000 g | ORAL_CAPSULE | Freq: Two times a day (BID) | ORAL | 0 refills | Status: DC

## 2018-08-13 MED ORDER — ALLOPURINOL 300 MG PO TABS: 300.0000 mg | ORAL_TABLET | Freq: Every day | ORAL | 0 refills | Status: DC

## 2018-08-13 MED ORDER — APIXABAN 2.5 MG PO TABS: 2.5000 mg | ORAL_TABLET | Freq: Two times a day (BID) | ORAL | 0 refills | Status: DC

## 2018-08-13 MED ORDER — SERTRALINE HCL 25 MG PO TABS: 25.0000 mg | ORAL_TABLET | Freq: Every day | ORAL | 0 refills | Status: DC

## 2018-08-13 MED ORDER — DOXEPIN HCL 100 MG PO CAPS: 100.0000 mg | ORAL_CAPSULE | Freq: Every day | ORAL | 0 refills | Status: DC

## 2018-08-13 MED ORDER — MIRTAZAPINE 15 MG PO TABS: 15.0000 mg | ORAL_TABLET | Freq: Every day | ORAL | 0 refills | Status: DC

## 2018-08-13 MED ORDER — CRISABOROLE 2 % EX OINT: 1.0000 "application " | TOPICAL_OINTMENT | Freq: Two times a day (BID) | CUTANEOUS | 0 refills | Status: DC

## 2018-08-13 NOTE — Progress Notes (Signed)
Location:   [facility] Room Number: [place] of Service:  SNF (31)    CODE STATUS: DNR  Allergies  Allergen Reactions  . Augmentin [Amoxicillin-Pot Clavulanate]   . Cymbalta [Duloxetine Hcl]   . Lyrica [Pregabalin]   . Oxycodone Other (See Comments)    Chief Complaint  Patient presents with  . Discharge Note    Discharging to home on 08/16/2018    HPI:  He is being discharged to home with home health for pt/ot/rn/cna. He will need a front wheel walker. He will need his prescriptions written and will need to follow up with his medical provider.  He had been hospitalized 3 days after fall; without fractures. He was admitted to this facility for short term rehab. He is now ready to complete his therapy on home health basis. He is able to ambulate over 100 feet. He states he feels as though he is not ready to return back home due to stiffness. He is going to appeal with his insurance company.    Past Medical History:  Diagnosis Date  . A-fib ([place])   . Neuropathy     Past Surgical History:  Procedure Laterality Date  . APPENDECTOMY    . BACK SURGERY    . EYE SURGERY     detached retina  . NASAL RECONSTRUCTION    . PROSTATE SURGERY      Social History   Socioeconomic History  . Marital status: Married    Spouse name: Not on file  . Number of children: Not on file  . Years of education: Not on file  . Highest education level: Not on file  Occupational History  . Not on file  Social Needs  . Financial resource strain: Not on file  . Food insecurity:    Worry: Not on file    Inability: Not on file  . Transportation needs:    Medical: Not on file    Non-medical: Not on file  Tobacco Use  . Smoking status: Never Smoker  . Smokeless tobacco: Never Used  Substance and Sexual Activity  . Alcohol use: Yes    Alcohol/week: 2.0 standard drinks    Types: 2 Glasses of wine per week    Comment: daily  . Drug use: No  . Sexual  activity: Not Currently    Partners: Male  Lifestyle  . Physical activity:    Days per week: Not on file    Minutes per session: Not on file  . Stress: Not on file  Relationships  . Social connections:    Talks on phone: Not on file    Gets together: Not on file    Attends religious service: Not on file    Active member of club or organization: Not on file    Attends meetings of clubs or organizations: Not on file    Relationship status: Not on file  . Intimate partner violence:    Fear of current or ex partner: Not on file    Emotionally abused: Not on file    Physically abused: Not on file    Forced sexual activity: Not on file  Other Topics Concern  . Not on file  Social History Narrative  . Not on file   History reviewed. No pertinent family history.  VITAL SIGNS BP (!) 143/68   Pulse 78   Temp 98 F (36.7 C)   Ht 5\' 11"  (1.803 m)   Wt 167 lb 3.2 oz (  75.8 kg)   BMI 23.32 kg/m   Patient's Medications  New Prescriptions   No medications on file  Previous Medications   ACETAMINOPHEN (TYLENOL) 500 MG TABLET    Take 1,000 mg by mouth 2 (two) times daily.    ALLOPURINOL (ZYLOPRIM) 300 MG TABLET    Take 300 mg by mouth daily.    APIXABAN (ELIQUIS) 2.5 MG TABS TABLET    Take 2.5 mg by mouth 2 (two) times daily.   ASPIRIN 81 MG CHEWABLE TABLET    Chew 81 mg by mouth daily.   BISACODYL LAXATIVE RE    If not relieved by MOM, give 10 mg Bisacodyl suppositiory rectally X 1 dose in 24 hours as needed (Do not use constipation standing orders for residents with renal failure/CFR less than 30. Contact MD for orders) (Physician Order)   CALCIUM-VITAMIN D ([facility] CALCIUM-600/VIT D PO)    Give 1 tablet by mouth twice a day   CARBOXYMETHYLCELLULOSE SODIUM (ARTIFICIAL TEARS OP)    Place 1 drop into both eyes daily.   CRISABOROLE (EUCRISA) 2 % OINT    Apply topically twice a day for severe pruritus and atopic dermatitis   DOXEPIN (SINEQUAN) 100 MG CAPSULE    100 mg. Give 1 capsule by mouth  nightly   FUROSEMIDE (LASIX) 40 MG TABLET    Take 40 mg by mouth daily.    GABAPENTIN (NEURONTIN) 100 MG CAPSULE    Take 2 capsules (200 mg) by mouth three times daily   HYDROCORTISONE 2.5 % CREAM    Apply to scalp, face, ears and neck daily   IMIQUIMOD (ALDARA) 5 % CREAM    Apply to affected area three times per week for severe pruritus and atopic dermatitis   MAGNESIUM 400 MG TABS    Give 1 tablet by mouth daily   MAGNESIUM HYDROXIDE (MILK OF MAGNESIA) 400 MG/5ML SUSPENSION    If no BM in 3 days, give 30 cc Milk of Magnesium p.o. x 1 dose in 24 hours as needed (Do not use standing constipation orders for residents with renal failure CFR less than 30. Contact MD for orders) (Physician Order)   MELATONIN 3 MG TABS    Give 1 tablet by mouth nightly   MIRTAZAPINE (REMERON) 15 MG TABLET    Take 15 mg by mouth at bedtime.   MOMETASONE (ELOCON) 0.1 % CREAM    Apply on itchy areas of body 2 times a day for Severe Pruritus   MULTIPLE VITAMINS-MINERALS (ICAPS AREDS 2 PO)    Take by mouth. Give 1 capsule by mouth every evening   MULTIPLE VITAMINS-MINERALS (MULTIVITAMIN ADULTS PO)    Give 1 tablet by mouth daily   NEOMYCIN-BACITRACIN-POLYMYXIN (NEOSPORIN) OINTMENT    Apply to skin tear to left elbow and cover with foam dressing every other day   OMEGA-3 ACID ETHYL ESTERS (LOVAZA) 1 G CAPSULE    Give 1 capsule by mouth twice a day   OMEPRAZOLE PO    20 MG CAPSULE: Give 1 capsule by mouth daily   POLYETHYLENE GLYCOL (MIRALAX / GLYCOLAX) 17 G PACKET    Take 17 g by mouth daily.   SACCHAROMYCES BOULARDII (PROBIOTIC) 250 MG CAPS    Take 1 capsule by mouth daily.   SERTRALINE (ZOLOFT) 25 MG TABLET    Give 1 tablet by mouth daily for chronic pain and mood   SODIUM PHOSPHATES (RA SALINE ENEMA RE)    If not relieved by Biscodyl suppository, give disposable Saline Enema rectally X 1  dose/24 hrs as needed (Do not use constipation standing orders for residents with renal failure/CFR less than 30. Contact MD for  orders)(Physician Or   TRIAMCINOLONE CREAM (KENALOG) 0.1 %    Apply to rough areas of body daily as needed   VITAMIN D, ERGOCALCIFEROL, PO    1,000 UNIT TABLET: Give 1 tablet by mouth every evening  Modified Medications   No medications on file  Discontinued Medications   No medications on file     SIGNIFICANT DIAGNOSTIC EXAMS  NONE AVAILABLE   Review of Systems  Constitutional: Negative for malaise/fatigue.  Respiratory: Negative for cough and shortness of breath.   Cardiovascular: Negative for chest pain, palpitations and leg swelling.  Gastrointestinal: Negative for abdominal pain, constipation and heartburn.  Musculoskeletal: Negative for back pain, joint pain and myalgias.  Skin: Negative.   Neurological: Negative for dizziness.  Psychiatric/Behavioral: The patient is not nervous/anxious.     Physical Exam Constitutional:      General: He is not in acute distress.    Appearance: He is well-developed. He is not diaphoretic.  Neck:     Musculoskeletal: Neck supple.     Thyroid: No thyromegaly.  Cardiovascular:     Rate and Rhythm: Normal rate and regular rhythm.     Pulses: Normal pulses.     Heart sounds: Normal heart sounds.  Pulmonary:     Effort: Pulmonary effort is normal. No respiratory distress.     Breath sounds: Normal breath sounds.  Abdominal:     General: Bowel sounds are normal. There is no distension.     Palpations: Abdomen is soft.     Tenderness: There is no abdominal tenderness.  Musculoskeletal:     Right lower leg: Edema present.     Left lower leg: Edema present.     Comments: Is able to move all extremities   Lymphadenopathy:     Cervical: No cervical adenopathy.  Skin:    General: Skin is warm and dry.  Neurological:     Mental Status: He is alert and oriented to person, place, and time.  Psychiatric:        Mood and Affect: Mood normal.        ASSESSMENT/ PLAN:   Patient is being discharged with the following home health  services:  Pt/ot/rn/cna: to evaluate and treat as indicated for gait balance strength adl training medication management and adl care.   Patient is being discharged with the following durable medical equipment:  Front wheel walker to allow him to maintain his current level of independence with his adls.  Patient has been advised to f/u with their PCP in 1-2 weeks to bring them up to date on their rehab stay.  Social services at facility was responsible for arranging this appointment.  Pt was provided with a 30 day supply of prescriptions for medications and refills must be obtained from their PCP.  For controlled substances, a more limited supply may be provided adequate until PCP appointment only.   A 30 day supply of his prescription medications to [facility] on [address].   Time spent with patient: 35 minutes: discussed home health needs; medications; dme; and the appeals process. Verbalized understanding.    Ok Edwards NP [facility]  Contact [phone] Monday through Friday 8am- 5pm  After hours call [phone]

## 2020-03-07 ENCOUNTER — Encounter: Payer: Self-pay | Admitting: Orthopedic Surgery

## 2020-03-07 ENCOUNTER — Non-Acute Institutional Stay (SKILLED_NURSING_FACILITY): Payer: Medicare HMO | Admitting: Orthopedic Surgery

## 2020-03-07 DIAGNOSIS — K219 Gastro-esophageal reflux disease without esophagitis: Secondary | ICD-10-CM

## 2020-03-07 DIAGNOSIS — I480 Paroxysmal atrial fibrillation: Secondary | ICD-10-CM | POA: Diagnosis not present

## 2020-03-07 DIAGNOSIS — G63 Polyneuropathy in diseases classified elsewhere: Secondary | ICD-10-CM | POA: Diagnosis not present

## 2020-03-07 DIAGNOSIS — E43 Unspecified severe protein-calorie malnutrition: Secondary | ICD-10-CM

## 2020-03-07 DIAGNOSIS — I10 Essential (primary) hypertension: Secondary | ICD-10-CM

## 2020-03-07 DIAGNOSIS — K5904 Chronic idiopathic constipation: Secondary | ICD-10-CM | POA: Diagnosis not present

## 2020-03-07 DIAGNOSIS — N184 Chronic kidney disease, stage 4 (severe): Secondary | ICD-10-CM

## 2020-03-07 DIAGNOSIS — F32A Depression, unspecified: Secondary | ICD-10-CM

## 2020-03-07 DIAGNOSIS — Z96641 Presence of right artificial hip joint: Secondary | ICD-10-CM

## 2020-03-07 LAB — BASIC METABOLIC PANEL
BUN: 33 — AB (ref 4–21)
CO2: 30 — AB (ref 13–22)
Chloride: 95 — AB (ref 99–108)
Creatinine: 1.1 (ref 0.6–1.3)
Glucose: 127
Potassium: 4.3 (ref 3.4–5.3)
Sodium: 136 — AB (ref 137–147)

## 2020-03-07 LAB — CBC AND DIFFERENTIAL
HCT: 29 — AB (ref 41–53)
Hemoglobin: 9.3 — AB (ref 13.5–17.5)
Platelets: 449 — AB (ref 150–399)
WBC: 9.7

## 2020-03-07 LAB — CBC: RBC: 3.4 — AB (ref 3.87–5.11)

## 2020-03-07 LAB — COMPREHENSIVE METABOLIC PANEL: Calcium: 9.1 (ref 8.7–10.7)

## 2020-03-07 NOTE — Progress Notes (Signed)
Location:    [place] Room Number: 102/P Place of Service:  SNF [phone]) Provider: Windell Moulding NP   Lowanda Foster, MD  Patient Care Team: Lowanda Foster, MD as PCP - General (Internal Medicine)  Extended Emergency Contact Information Primary Emergency Contact: Yakubov,Gloria T Address: [address] [place], [place] [address] [place] Phone: [phone] Relation: Spouse  Code Status: DNR  Goals of care: Advanced Directive information Advanced Directives 03/07/2020  Does Patient Have a Medical Advance Directive? Yes  Type of Advance Directive Out of facility DNR (pink MOST or yellow form)  Does patient want to make changes to medical advance directive? No - Patient declined  Copy of [place] in Chart? -  Pre-existing out of facility DNR order (yellow form or pink MOST form) Yellow form placed in chart (order not valid for inpatient use)     Chief Complaint  Patient presents with  . Hospitalization Follow-up    Hospitalization Follow Up     HPI:  Pt is a 84 y.o. male seen today for a hospital f/u s/p admission from right total hip arthroplasty.   He has been a resident of [facility] since 12/23, seen at bedside today. PMH includes: atrial fibrillation, prostate cancer, GERD, gout, celiac disease, hypertension, anemia of chronic disease, chronic kidney disease stage IV, protein- calorie malnutrition, umbilical hernia, vitamin D deficiency, and squamous cell carcinoma of forehead.   12/21 he underwent right anterior total hip arthroplasty with Dr. Tamala Julian at [facility]. He tolerated procedure well and was transferred to orthopedic unit. During his hospitalization he received PT/OT services. He was given pain medication for postoperative pain. PT recommended snf for additional PT/OT and skilled nursing services. He also received dvt prophylaxis Pradaxa, SCDs  and TED hose stockings.   Today, he is sitting in his wheelchair watching television. He is alert and oriented x 4. Follows commands and can express needs. Ambulating about 15 feet x 2 with front wheeled walker and minimal assist. States his pain is worst after PT/OT therapies. Asking when he can have pain medicine again. Prevena wound vac removed 12/28, he tolerated procedure well. Incision covered with dry dressing. He saw Dr. Tamala Julian today and was advised to remain WBAT. In addition he received a right shoulder injection for pain. Dr. Tamala Julian advised he follow up in 1 week.   He states he has only had a small bowel movement during his stay at [facility]. Drinking fluids often and tries to eat fruits and vegetables. Eating 1-2 meals daily, does not care for food served at [employer]. Requesting medication to help bowels move. Denies abdominal pain and nausea.   Tells me his wife passed away, does not say when. Daughter and close friends main support systems. They have been bringing him food often. Table in room filled with snacks.   No recent falls or injuries.   Facility nurse does not report any concerns, vitals stable.   Past Medical History:  Diagnosis Date  . A-fib ([place])   . Neuropathy    Past Surgical History:  Procedure Laterality Date  . APPENDECTOMY    . BACK SURGERY    . EYE SURGERY     detached retina  . NASAL RECONSTRUCTION    . PROSTATE SURGERY      Allergies  Allergen Reactions  . Amoxicillin-Pot Clavulanate Other (See Comments)  .  Duloxetine Hcl Other (See Comments)  . Other Other (See Comments)  . Pregabalin Other (See Comments)  . Oxycodone Other (See Comments)    Allergies as of 03/07/2020      Reactions   Amoxicillin-pot Clavulanate Other (See Comments)   Duloxetine Hcl Other (See Comments)   Other Other (See Comments)   Pregabalin Other (See Comments)   Oxycodone Other (See Comments)      Medication List       Accurate as of March 07, 2020 11:54  AM. If you have any questions, ask your nurse or doctor.        STOP taking these medications   allopurinol 300 MG tablet Commonly known as: ZYLOPRIM Stopped by: Yvonna Alanis, NP   apixaban 2.5 MG Tabs tablet Commonly known as: Eliquis Stopped by: Yvonna Alanis, NP   ARTIFICIAL TEARS OP Stopped by: Yvonna Alanis, NP   aspirin 81 MG chewable tablet Stopped by: Yvonna Alanis, NP   BISACODYL LAXATIVE RE Stopped by: Yvonna Alanis, NP   [facility]S CALCIUM-600/VIT D PO Stopped by: Yvonna Alanis, NP   doxepin 100 MG capsule Commonly known as: SINEQUAN Stopped by: Yvonna Alanis, NP   furosemide 40 MG tablet Commonly known as: LASIX Stopped by: Yvonna Alanis, NP   hydrocortisone 2.5 % cream Stopped by: Yvonna Alanis, NP   imiquimod 5 % cream Commonly known as: ALDARA Stopped by: Yvonna Alanis, NP   Magnesium 400 MG Tabs Stopped by: Yvonna Alanis, NP   magnesium hydroxide 400 MG/5ML suspension Commonly known as: MILK OF MAGNESIA Stopped by: Yvonna Alanis, NP   melatonin 3 MG Tabs tablet Stopped by: Yvonna Alanis, NP   neomycin-bacitracin-polymyxin ointment Commonly known as: NEOSPORIN Stopped by: Yvonna Alanis, NP   omega-3 acid ethyl esters 1 g capsule Commonly known as: LOVAZA Stopped by: Yvonna Alanis, NP   polyethylene glycol 17 g packet Commonly known as: MIRALAX / GLYCOLAX Stopped by: Yvonna Alanis, NP   Probiotic 250 MG Caps Stopped by: Yvonna Alanis, NP   RA SALINE ENEMA RE Stopped by: Yvonna Alanis, NP   VITAMIN D (ERGOCALCIFEROL) PO Stopped by: Yvonna Alanis, NP     TAKE these medications   acetaminophen 500 MG tablet Commonly known as: TYLENOL Take 500 mg by mouth 2 (two) times daily.   Calamine Plus 1-8 % Lotn Generic drug: diphenhydramine-calamine Apply 1 application topically 2 (two) times daily as needed. APPLY TOPICALLY AS NEEDED FOR ECZEMA   dupilumab 200 MG/1.14ML prefilled syringe Commonly known as: DUPIXENT Inject 300 mg into the skin every 14 (fourteen) days. FOR  ECZEMA   ENSURE PO Take 1 Container by mouth in the morning and at bedtime. (prefers Vanilla) d/t decreased intake.   Eucrisa 2 % Oint Generic drug: Crisaborole Apply 1 application topically as needed. APPLY TOPICALLY AS NEEDED FOR ECZEMA What changed: Another medication with the same name was removed. Continue taking this medication, and follow the directions you see here. Changed by: Yvonna Alanis, NP   gabapentin 300 MG capsule Commonly known as: NEURONTIN Take 300 mg by mouth 2 (two) times daily. FOR PERIPHERAL NEUROPATHY What changed: Another medication with the same name was removed. Continue taking this medication, and follow the directions you see here. Changed by: Yvonna Alanis, NP   GenTeal Tears Severe Day/Night 0.4-0.3 % Gel ophthalmic gel Generic drug: Polyethyl Glycol-Propyl Glycol Place 1 application into both eyes every 12 (  twelve) hours as needed. For Dry Eyes   ICAPS AREDS 2 PO Take by mouth. Give 1 capsule by mouth every evening   MULTIVITAMIN ADULTS PO Give 1 tablet by mouth daily   mirtazapine 30 MG tablet Commonly known as: REMERON Take 30 mg by mouth at bedtime. What changed: Another medication with the same name was removed. Continue taking this medication, and follow the directions you see here. Changed by: Yvonna Alanis, NP   mometasone 0.1 % cream Commonly known as: ELOCON Apply topically 2 (two) times a day. Apply on itchy areas of body 2 times a day for Severe Pruritus   OMEPRAZOLE PO 20 MG CAPSULE: Give 1 capsule by mouth daily   Pradaxa 75 MG Caps capsule Generic drug: dabigatran Take 75 mg by mouth 2 (two) times daily. FOR AFIB   sertraline 25 MG tablet Commonly known as: ZOLOFT Take 1 tablet (25 mg total) by mouth daily. Give 1 tablet by mouth daily for chronic pain and mood   tamsulosin 0.4 MG Caps capsule Commonly known as: FLOMAX Take 0.4 mg by mouth daily.   torsemide 20 MG tablet Commonly known as: DEMADEX Take 20 mg by mouth  daily. FOR HTN/CKD   traMADol 100 MG 24 hr tablet Commonly known as: ULTRAM-ER Take 100 mg by mouth every 6 (six) hours as needed for pain. for pain x 7 days   triamcinolone 0.1 % Commonly known as: KENALOG Apply topically daily. Apply to rough areas of body daily as needed       Review of Systems  Constitutional: Negative for activity change, appetite change and fever.  HENT: Positive for hearing loss. Negative for sore throat and trouble swallowing.        HOH  Eyes: Negative for visual disturbance.  Respiratory: Negative for cough, shortness of breath and wheezing.   Cardiovascular: Negative for chest pain, palpitations and leg swelling.  Gastrointestinal: Positive for abdominal distention and constipation. Negative for abdominal pain, diarrhea and nausea.  Genitourinary: Negative for dysuria and hematuria.  Musculoskeletal: Positive for arthralgias and myalgias.       Right hip and shoulder pain, history of falls  Skin:       Surgical incision, skin cancer to forehead  Neurological: Positive for weakness. Negative for dizziness and headaches.  Psychiatric/Behavioral: Negative for confusion, dysphoric mood and sleep disturbance. The patient is not nervous/anxious.     Immunization History  Administered Date(s) Administered  . Influenza Split 12/08/2017, 12/15/2017  . Influenza, High Dose Seasonal PF 12/15/2016, 05/05/2019, 02/08/2020  . Influenza,inj,Quad PF,6+ Mos 12/15/2017  . Influenza-Unspecified 01/22/2015, 12/08/2017  . Janssen (J&J) SARS-COV-2 Vaccination 07/14/2019  . Moderna Sars-Covid-2 Vaccination 01/26/2020  . Pneumococcal-Unspecified 12/21/2012  . Tdap 09/26/2017   Pertinent  Health Maintenance Due  Topic Date Due  . PNA vac Low Risk Adult (2 of 2 - PCV13) 12/21/2013  . INFLUENZA VACCINE  Completed   No flowsheet data found. Functional Status Survey:    Vitals:   03/07/20 1020  BP: 110/61  Pulse: 68  Resp: 19  Temp: (!) 97.5 F (36.4 C)  SpO2:  95%  Weight: 147 lb (66.7 kg)  Height: 5\' 11"  (1.803 m)   Body mass index is 20.5 kg/m. Physical Exam Vitals reviewed.  Constitutional:      General: He is not in acute distress.    Appearance: Normal appearance.  HENT:     Head: Normocephalic and atraumatic.     Right Ear: There is no impacted cerumen.  Left Ear: There is no impacted cerumen.     Mouth/Throat:     Pharynx: No oropharyngeal exudate.  Eyes:     Conjunctiva/sclera: Conjunctivae normal.     Pupils: Pupils are equal, round, and reactive to light.  Cardiovascular:     Rate and Rhythm: Normal rate. Rhythm irregular.     Pulses: Normal pulses.     Heart sounds: Normal heart sounds.  Pulmonary:     Effort: Pulmonary effort is normal.     Breath sounds: Normal breath sounds.  Abdominal:     General: There is distension.     Tenderness: There is no abdominal tenderness.     Comments: Bowel sounds active x 4  Musculoskeletal:        General: Normal range of motion.     Cervical back: Normal range of motion.     Right lower leg: Edema present.     Left lower leg: Edema present.     Comments: Right hip incision CDI, no drainage. Surrounding tissue intact. Pedal pulse +1, dorsalflexion 4/5.   Lymphadenopathy:     Cervical: Cervical adenopathy present.  Skin:    General: Skin is warm and dry.     Capillary Refill: Capillary refill takes less than 2 seconds.  Neurological:     Mental Status: He is alert and oriented to person, place, and time.     Motor: Weakness present.  Psychiatric:        Mood and Affect: Mood normal.        Behavior: Behavior normal.     Labs reviewed: No results for input(s): NA, K, CL, CO2, GLUCOSE, BUN, CREATININE, CALCIUM, MG, PHOS in the last 8760 hours. No results for input(s): AST, ALT, ALKPHOS, BILITOT, PROT, ALBUMIN in the last 8760 hours. No results for input(s): WBC, NEUTROABS, HGB, HCT, MCV, PLT in the last 8760 hours. No results found for: TSH No results found for:  HGBA1C No results found for: CHOL, HDL, LDLCALC, LDLDIRECT, TRIG, CHOLHDL  Significant Diagnostic Results in last 30 days:  No results found.  Assessment/Plan 1. Essential hypertension - bp at goal < 150/90 - continue torsemide 20 mg daily - continue to limit sodium in diet to < 2000 mg daily  2. Chronic idiopathic constipation - abdomen appears distended, patient denies nausea and abdominal pain - encourage hydration with water - will start colace 100 mg po bid - will start miralax 17g, mix with 4 ounces liquid po daily prn  3. Paroxysmal atrial fibrillation (HCC) - rate controlled with without medication - continue Pradaxa for dvt prophylaxis  4. Polyneuropathy associated with underlying disease ([place]) - stable with medication - continue gabapentin 300 mg po bid  5. Status post total replacement of right hip - followed by Dr. Tamala Julian, last seen yesterday, advised to f/u next week - prevena wound vac d/c, continue dry dressing changes - no signs on infection at this time - continue tylenol prn for pain - if tylenol ineffective, will consider restarting tramadol - continue PT/OT and snf services  6. Gastroesophageal reflux disease without esophagitis - stable with medication, continue omeprazole  7. Chronic kidney disease, stage 4 (severe) (HCC) - continue to avoid nephrotoxic drugs like NSAIDS, and dose adjust medication to be renally adjusted - continue daily torsemide  8. Severe protein- calorie malnutrition - eating about 1-2 meals daily - continue ensure po bid - continue remeron 30 mg at bedtime  9. Depression, unspecified depression type - states his wife passed away, do not  know how long ago - continue zoloft daily   Family/ staff Communication: Plan discussed with patient and facility nurse  Labs/tests ordered:  None- cbc/diff, bmp drawn today awaiting results

## 2020-03-12 ENCOUNTER — Encounter: Payer: Self-pay | Admitting: Internal Medicine

## 2020-03-15 ENCOUNTER — Non-Acute Institutional Stay (SKILLED_NURSING_FACILITY): Payer: Medicare HMO | Admitting: Orthopedic Surgery

## 2020-03-15 ENCOUNTER — Encounter: Payer: Self-pay | Admitting: Orthopedic Surgery

## 2020-03-15 DIAGNOSIS — G63 Polyneuropathy in diseases classified elsewhere: Secondary | ICD-10-CM | POA: Diagnosis not present

## 2020-03-15 DIAGNOSIS — N184 Chronic kidney disease, stage 4 (severe): Secondary | ICD-10-CM

## 2020-03-15 DIAGNOSIS — I480 Paroxysmal atrial fibrillation: Secondary | ICD-10-CM

## 2020-03-15 DIAGNOSIS — K567 Ileus, unspecified: Secondary | ICD-10-CM

## 2020-03-15 DIAGNOSIS — Z96641 Presence of right artificial hip joint: Secondary | ICD-10-CM

## 2020-03-15 DIAGNOSIS — K5904 Chronic idiopathic constipation: Secondary | ICD-10-CM

## 2020-03-15 DIAGNOSIS — K219 Gastro-esophageal reflux disease without esophagitis: Secondary | ICD-10-CM

## 2020-03-15 DIAGNOSIS — I10 Essential (primary) hypertension: Secondary | ICD-10-CM | POA: Diagnosis not present

## 2020-03-15 DIAGNOSIS — T50905A Adverse effect of unspecified drugs, medicaments and biological substances, initial encounter: Secondary | ICD-10-CM

## 2020-03-15 NOTE — Progress Notes (Signed)
Location:    [facility] Room Number: 425/W Place of Service:  SNF [id number]) Provider:  Windell Moulding, AGNP-C  Lowanda Foster, MD  Patient Care Team: Lowanda Foster, MD as PCP - General (Internal Medicine)  Extended Emergency Contact Information Primary Emergency Contact: Janco,Gloria T Address: [address] [place], [place] [address] [place] Phone: [phone] Relation: Spouse  Code Status: DNR  Goals of care: Advanced Directive information Advanced Directives 03/15/2020  Does Patient Have a Medical Advance Directive? Yes  Type of Advance Directive Out of facility DNR (pink MOST or yellow form)  Does patient want to make changes to medical advance directive? No - Patient declined  Copy of [place] in Chart? -  Pre-existing out of facility DNR order (yellow form or pink MOST form) Yellow form placed in chart (order not valid for inpatient use)     Chief Complaint  Patient presents with  . Hospitalization Follow-up    Hospitalization Follow Up     HPI:  Pt is a 85 y.o. male seen today for a hospital f/u s/p admission from [facility] 01/01- 01/05.   Prior to hospitalization he was a resident of [facility]. PMH includes: atrial fibrillation, prostate cancer, GERD, gout, celiac disease, hypertension, chronic kidney disease stage V, protein-calorie malnutrition, umbilical hernia, vit d deficiency, and squamous cell cancer of forehead. He was receiving PT/OT and skilled nursing services for right total hip arthroplasty performed by Dr. Tamala Julian 12/21. 12/31 he began to have increased abdominal pain, nausea and vomiting.  He was taken by EMS to [facility] 01/01. On exam he was found to have significant abdominal distention, n/v, and abdominal pain. CT without gastric outlet obstruction. Symptoms believed to be related to post surgical narcotic use suggesting ileus. He  was admitted with a diagnosis of suspected ileus. During his hospitalization he was made NPO and given IVF. NG tube placed for decompression. Received IV Zofran for nausea. Narcotic pain medication discontinued. Given home gabapentin, tylenol and lidocaine patch for pain. Electrolytes replaced prn. Abdominal CT also revealed two small lacerations within the spleen, suggesting perisplenic hemorrhage. Since he was hemodynamically stable and asymptomatic, cbc was monitored daily. Surgery was also consulted, but did not believe it was a splenic laceration. GI consulted to r/o gastric outlet obstruction. EGD performed 01/03 with no sign of gastric outlet obstruction. NG tube was removed and diet advanced to clear liquids. He began to have normal bowel movements. In addition, chest x-ray found bilateral pleural effusions concerning for congestive heart failure. He was breathing well with minimal edema to extremities. Torsemide used in the past intermittently. TTE performed, preliminary results found dilated right and left atrium and preserved EF. At the end of his hospital stay he was found to be Covid positive by antigen and pcr. He had complained of a nonproductive cough throughout his stay. He is fully vaccinated and received booster 01/26/20. He was discharged back to [facility] after hospitalization.   Today, he remains on droplet precautions due to testing positive for covid. He is alert and oriented x 4. Follows commands and can state needs. He denies any covid symptoms except for a dry cough at times. He denies abdominal pain, but states his belly is starting to feel tight. He has not had a bowel movement since arrival. His hip pain is minimal with tylenol and gabapentin. Ambulating  about 125 ft with walker, minimal assistance. Denies chest pain or shortness of breath.   No injuries or falls reported.   Facility nurse does not report any concerns, vitals stable.   Past Medical History:  Diagnosis Date  .  A-fib ([place])   . Neuropathy    Past Surgical History:  Procedure Laterality Date  . APPENDECTOMY    . BACK SURGERY    . EYE SURGERY     detached retina  . NASAL RECONSTRUCTION    . PROSTATE SURGERY      Allergies  Allergen Reactions  . Amoxicillin-Pot Clavulanate Other (See Comments)  . Duloxetine Hcl Other (See Comments)  . Other Other (See Comments)  . Pregabalin Other (See Comments)  . Oxycodone Other (See Comments)    Allergies as of 03/15/2020      Reactions   Amoxicillin-pot Clavulanate Other (See Comments)   Duloxetine Hcl Other (See Comments)   Other Other (See Comments)   Pregabalin Other (See Comments)   Oxycodone Other (See Comments)      Medication List       Accurate as of March 15, 2020  9:18 AM. If you have any questions, ask your nurse or doctor.        acetaminophen 500 MG tablet Commonly known as: TYLENOL Take 500 mg by mouth every 6 (six) hours as needed.   alum & mag hydroxide-simeth 119-417-40 MG/5ML suspension Commonly known as: MAALOX PLUS Take 30 mLs by mouth every 4 (four) hours as needed for indigestion.   Calamine Plus 1-8 % Lotn Generic drug: diphenhydramine-calamine Apply 1 application topically 2 (two) times daily as needed. APPLY TOPICALLY AS NEEDED FOR ECZEMA   dabigatran 75 MG Caps capsule Commonly known as: PRADAXA Take 75 mg by mouth 2 (two) times daily. FOR AFIB   docusate sodium 100 MG capsule Commonly known as: COLACE Take 100 mg by mouth 2 (two) times daily.   dupilumab 200 MG/1.14ML prefilled syringe Commonly known as: DUPIXENT Inject 300 mg into the skin every 14 (fourteen) days. FOR ECZEMA   ENSURE PO Take 1 Container by mouth in the morning and at bedtime. (prefers Vanilla) d/t decreased intake.   Eucrisa 2 % Oint Generic drug: Crisaborole Apply 1 application topically as needed. APPLY TOPICALLY AS NEEDED FOR ECZEMA   gabapentin 300 MG capsule Commonly known as: NEURONTIN Take 300 mg by mouth 2 (two) times  daily. FOR PERIPHERAL NEUROPATHY   GenTeal Tears Severe Day/Night 0.4-0.3 % Gel ophthalmic gel Generic drug: Polyethyl Glycol-Propyl Glycol Place 1 application into both eyes every 12 (twelve) hours as needed. For Dry Eyes   ICAPS AREDS 2 PO Take by mouth. Give 1 capsule by mouth every evening   MULTIVITAMIN ADULTS PO Give 1 tablet by mouth daily   mirtazapine 30 MG tablet Commonly known as: REMERON Take 30 mg by mouth at bedtime.   mometasone 0.1 % cream Commonly known as: ELOCON Apply topically 2 (two) times a day. Apply on itchy areas of body 2 times a day for Severe Pruritus   OMEPRAZOLE PO 20 MG CAPSULE: Give 1 capsule by mouth daily   polyethylene glycol 17 g packet Commonly known as: MIRALAX / GLYCOLAX Take 17 g by mouth daily as needed.   sertraline 25 MG tablet Commonly known as: ZOLOFT Take 1 tablet (25 mg total) by mouth daily. Give 1 tablet by mouth daily for chronic pain and mood   tamsulosin 0.4 MG Caps capsule Commonly known as: FLOMAX Take 0.4 mg by  mouth daily.   torsemide 20 MG tablet Commonly known as: DEMADEX Take 20 mg by mouth daily. FOR HTN/CKD   triamcinolone 0.1 % Commonly known as: KENALOG Apply topically daily. Apply to rough areas of body daily as needed       Review of Systems  Constitutional: Negative for activity change, appetite change and fever.  HENT: Positive for hearing loss. Negative for dental problem, rhinorrhea, sore throat and trouble swallowing.        HOH  Eyes: Negative for photophobia.       Glasses  Respiratory: Positive for cough. Negative for shortness of breath and wheezing.        Dry cough  Cardiovascular: Positive for leg swelling. Negative for chest pain.  Gastrointestinal: Positive for abdominal distention and constipation. Negative for diarrhea and nausea.  Genitourinary: Negative for dysuria, frequency and hematuria.  Musculoskeletal: Positive for arthralgias and myalgias.       Right hip pain  Skin:        Dry skin, surgical incision  Neurological: Positive for weakness. Negative for dizziness and headaches.  Psychiatric/Behavioral: Negative for confusion and dysphoric mood. The patient is not nervous/anxious.     Immunization History  Administered Date(s) Administered  . Influenza Split 12/08/2017, 12/15/2017  . Influenza, High Dose Seasonal PF 12/15/2016, 05/05/2019, 02/08/2020  . Influenza,inj,Quad PF,6+ Mos 12/15/2017  . Influenza-Unspecified 01/22/2015, 12/08/2017  . Janssen (J&J) SARS-COV-2 Vaccination 07/14/2019  . Moderna Sars-Covid-2 Vaccination 01/26/2020  . Pneumococcal-Unspecified 12/21/2012  . Tdap 09/26/2017   Pertinent  Health Maintenance Due  Topic Date Due  . PNA vac Low Risk Adult (2 of 2 - PCV13) 12/21/2013  . INFLUENZA VACCINE  Completed   No flowsheet data found. Functional Status Survey:    Vitals:   03/15/20 0845  BP: (!) 148/90  Pulse: (!) 54  Resp: 19  Temp: 98.1 F (36.7 C)  Weight: 147 lb (66.7 kg)  Height: 5\' 11"  (1.803 m)   Body mass index is 20.5 kg/m. Physical Exam Vitals reviewed.  Constitutional:      General: He is not in acute distress. HENT:     Head: Normocephalic and atraumatic.     Right Ear: There is no impacted cerumen.     Left Ear: There is no impacted cerumen.     Nose: Nose normal.     Mouth/Throat:     Mouth: Mucous membranes are moist.     Pharynx: No oropharyngeal exudate or posterior oropharyngeal erythema.  Eyes:     General:        Right eye: No discharge.        Left eye: No discharge.     Pupils: Pupils are equal, round, and reactive to light.  Cardiovascular:     Rate and Rhythm: Normal rate and regular rhythm.     Pulses: Normal pulses.     Heart sounds: Normal heart sounds.  Pulmonary:     Effort: Pulmonary effort is normal.     Breath sounds: Normal breath sounds.  Abdominal:     General: Bowel sounds are normal. There is distension.     Tenderness: There is no abdominal tenderness.     Hernia:  A hernia is present.     Comments: Umbilical hernia present  Musculoskeletal:        General: Normal range of motion.     Cervical back: Normal range of motion.     Right lower leg: Edema present.     Left lower leg: Edema present.  Comments: Right hip incision CDI, no drainage. Surrounding tissue intact. BLE with non-pitting edema.   Lymphadenopathy:     Cervical: No cervical adenopathy.  Skin:    General: Skin is warm and dry.     Capillary Refill: Capillary refill takes less than 2 seconds.  Neurological:     General: No focal deficit present.     Mental Status: He is alert and oriented to person, place, and time.     Motor: Weakness present.     Gait: Gait abnormal.     Comments: walker  Psychiatric:        Mood and Affect: Mood normal.        Behavior: Behavior normal.     Labs reviewed: Recent Labs    03/07/20 0000  NA 136*  K 4.3  CL 95*  CO2 30*  BUN 33*  CREATININE 1.1  CALCIUM 9.1   No results for input(s): AST, ALT, ALKPHOS, BILITOT, PROT, ALBUMIN in the last 8760 hours. Recent Labs    03/07/20 0000  WBC 9.7  HGB 9.3*  HCT 29*  PLT 449*   No results found for: TSH No results found for: HGBA1C No results found for: CHOL, HDL, LDLCALC, LDLDIRECT, TRIG, CHOLHDL  Significant Diagnostic Results in last 30 days:  No results found.  Assessment/Plan 1. Essential hypertension - bp at goal < 150/90 - continue to limit sodium in diet < 2000 mg/day - cbc/diff in 1 week  2. Chronic idiopathic constipation - ongoing, abdomen distended today - will increased miralax to po bid x 7 days - encourage ambulation - continue PT/OT - continue colace po bid  3. Paroxysmal atrial fibrillation (HCC) - rate controlled without meds - continue pradaxa for dvt prophylaxis  4. Polyneuropathy associated with underlying disease ([place]) - stable with gabapentin - continue gabapentin 300 mg po bid  5. Status post total replacement of right hip - followed by Dr.  Tamala Julian - surgical incision closed, CDI - continue tylenol for pain - continue PT/OT  6. Gastroesophageal reflux disease without esophagitis - stable with omeprazole  7. Chronic kidney disease, stage 4 (severe) (HCC) - continue to avoid nephrotoxic drugs like NSAIDS, and dose adjust medication to be renally adjusted - continue torsemide 20 mg daily - bmp in 1 week  8. Drug-induced ileus ([place]) - abdomen distended today - continue to avoid narcotics for pain - will increased miralax to po bid x 7 days - encourage ambulation - continue PT/OT - continue colace po bid    Family/ staff Communication: Plan discussed with patient and facility nurse  Labs/tests ordered:  Cbc/diff, bmp in 1 week

## 2020-03-16 ENCOUNTER — Non-Acute Institutional Stay (SKILLED_NURSING_FACILITY): Payer: Medicare HMO | Admitting: Internal Medicine

## 2020-03-16 DIAGNOSIS — L309 Dermatitis, unspecified: Secondary | ICD-10-CM

## 2020-03-16 DIAGNOSIS — E43 Unspecified severe protein-calorie malnutrition: Secondary | ICD-10-CM

## 2020-03-16 DIAGNOSIS — N184 Chronic kidney disease, stage 4 (severe): Secondary | ICD-10-CM | POA: Diagnosis not present

## 2020-03-16 DIAGNOSIS — T50905A Adverse effect of unspecified drugs, medicaments and biological substances, initial encounter: Secondary | ICD-10-CM

## 2020-03-16 DIAGNOSIS — K567 Ileus, unspecified: Secondary | ICD-10-CM

## 2020-03-16 DIAGNOSIS — M48061 Spinal stenosis, lumbar region without neurogenic claudication: Secondary | ICD-10-CM

## 2020-03-16 DIAGNOSIS — M5416 Radiculopathy, lumbar region: Secondary | ICD-10-CM

## 2020-03-16 DIAGNOSIS — U071 COVID-19: Secondary | ICD-10-CM | POA: Diagnosis not present

## 2020-03-16 DIAGNOSIS — I480 Paroxysmal atrial fibrillation: Secondary | ICD-10-CM

## 2020-03-16 DIAGNOSIS — K219 Gastro-esophageal reflux disease without esophagitis: Secondary | ICD-10-CM

## 2020-03-16 DIAGNOSIS — Z66 Do not resuscitate: Secondary | ICD-10-CM

## 2020-03-16 DIAGNOSIS — I10 Essential (primary) hypertension: Secondary | ICD-10-CM

## 2020-03-16 DIAGNOSIS — Z96641 Presence of right artificial hip joint: Secondary | ICD-10-CM

## 2020-03-16 NOTE — Progress Notes (Signed)
Provider:  Rexene Edison. Mariea Clonts, D.O., C.M.D. Location:  [place] of Service:  SNF (31)  PCP: Lowanda Foster, MD Patient Care Team: Lowanda Foster, MD as PCP - General (Internal Medicine)  Extended Emergency Contact Information Primary Emergency Contact: Maddison,Gloria T Address: [address] [place], [place] [address] [place] Phone: [phone] Relation: Spouse  Code Status: DNR Goals of Care: Advanced Directive information Advanced Directives 03/15/2020  Does Patient Have a Medical Advance Directive? Yes  Type of Advance Directive Out of facility DNR (pink MOST or yellow form)  Does patient want to make changes to medical advance directive? No - Patient declined  Copy of [place] in Chart? -  Pre-existing out of facility DNR order (yellow form or pink MOST form) Yellow form placed in chart (order not valid for inpatient use)   Chief Complaint  Patient presents with  . New Admit To SNF    Readmission s/p hospitalization    HPI: Patient is a 85 y.o. male seen today for admission to [facility] s/p hospitalization with splenic laceration, ileus and pleural effusions.  At discharge from the hospital, his covid test was positive.  Per NP hospital f/u note:  "Prior to hospitalization he was a resident of [facility] from 12/23-1/1 following right anterior total hip arthroplasty by Dr. Tamala Julian. PMH includes: atrial fibrillation, prostate cancer, GERD, gout, celiac disease, hypertension, chronic kidney disease stage V, protein-calorie malnutrition, umbilical hernia, vit d deficiency, and squamous cell cancer of forehead. He was receiving PT/OT and skilled nursing services for right total hip arthroplasty performed by Dr. Tamala Julian 12/21. 12/31 he began to have increased abdominal pain, nausea and vomiting.  He was taken by EMS to [facility] 01/01. On exam he was found to have  significant abdominal distention, n/v, and abdominal pain. CT without gastric outlet obstruction. Symptoms believed to be related to post surgical narcotic use suggesting ileus. He was admitted with a diagnosis of suspected ileus. During his hospitalization he was made NPO and given IVF. NG tube placed for decompression. Received IV Zofran for nausea. Narcotic pain medication discontinued. Given home gabapentin, tylenol and lidocaine patch for pain. Electrolytes replaced prn. Abdominal CT also revealed two small lacerations within the spleen, suggesting perisplenic hemorrhage. Since he was hemodynamically stable and asymptomatic, cbc was monitored daily. Surgery was also consulted, but did not believe it was a splenic laceration. GI consulted to r/o gastric outlet obstruction. EGD performed 01/03 with no sign of gastric outlet obstruction. NG tube was removed and diet advanced to clear liquids. He began to have normal bowel movements. In addition, chest x-ray found bilateral pleural effusions concerning for congestive heart failure. He was breathing well with minimal edema to extremities. Torsemide used in the past intermittently. TTE performed, preliminary results found dilated right and left atrium and preserved EF. At the end of his hospital stay he was found to be Covid positive by antigen and pcr. He had complained of a nonproductive cough throughout his stay. He is fully vaccinated and received booster 01/26/20. He was discharged back to [facility] after hospitalization."  Per nursing, he is doing well despite his covid diagnosis.  He is on droplet precautions and only having a dry cough.  He is now able to ambulate some with PT with his walker and is requiring minimal assistance.  Vitals stable.  No  loss of taste or smell.  No headache or myalgias.  Hip incision healing.  Past Medical History:  Diagnosis Date  . A-fib ([place])   . Neuropathy    Past Surgical History:  Procedure Laterality Date  .  APPENDECTOMY    . BACK SURGERY    . EYE SURGERY     detached retina  . NASAL RECONSTRUCTION    . PROSTATE SURGERY      Social History   Socioeconomic History  . Marital status: Married    Spouse name: Not on file  . Number of children: Not on file  . Years of education: Not on file  . Highest education level: Not on file  Occupational History  . Not on file  Tobacco Use  . Smoking status: Never Smoker  . Smokeless tobacco: Never Used  Substance and Sexual Activity  . Alcohol use: Yes    Alcohol/week: 2.0 standard drinks    Types: 2 Glasses of wine per week    Comment: daily  . Drug use: No  . Sexual activity: Not Currently    Partners: Male  Other Topics Concern  . Not on file  Social History Narrative  . Not on file   Social Determinants of Health   Financial Resource Strain: Not on file  Food Insecurity: Not on file  Transportation Needs: Not on file  Physical Activity: Not on file  Stress: Not on file  Social Connections: Not on file    reports that he has never smoked. He has never used smokeless tobacco. He reports current alcohol use of about 2.0 standard drinks of alcohol per week. He reports that he does not use drugs.  Functional Status Survey:    No family history on file.  Health Maintenance  Topic Date Due  . PNA vac Low Risk Adult (2 of 2 - PCV13) 12/21/2013  . TETANUS/TDAP  09/27/2027  . INFLUENZA VACCINE  Completed  . COVID-19 Vaccine  Completed    Allergies  Allergen Reactions  . Amoxicillin-Pot Clavulanate Other (See Comments)  . Duloxetine Hcl Other (See Comments)  . Other Other (See Comments)  . Pregabalin Other (See Comments)  . Oxycodone Other (See Comments)    Outpatient Encounter Medications as of 03/16/2020  Medication Sig  . acetaminophen (TYLENOL) 500 MG tablet Take 500 mg by mouth every 6 (six) hours as needed.  Marland Kitchen alum & mag hydroxide-simeth (MAALOX PLUS) 400-400-40 MG/5ML suspension Take 30 mLs by mouth every 4 (four)  hours as needed for indigestion.  . dabigatran (PRADAXA) 75 MG CAPS capsule Take 75 mg by mouth 2 (two) times daily. FOR AFIB  . docusate sodium (COLACE) 100 MG capsule Take 100 mg by mouth 2 (two) times daily.  . dupilumab (DUPIXENT) 200 MG/1.14ML prefilled syringe Inject 300 mg into the skin every 14 (fourteen) days. FOR ECZEMA  . gabapentin (NEURONTIN) 300 MG capsule Take 300 mg by mouth 2 (two) times daily. FOR PERIPHERAL NEUROPATHY  . mirtazapine (REMERON) 30 MG tablet Take 30 mg by mouth at bedtime.  . Multiple Vitamins-Minerals (ICAPS AREDS 2 PO) Take by mouth. Give 1 capsule by mouth every evening  . Nutritional Supplements (ENSURE PO) Take 1 Container by mouth in the morning and at bedtime. (prefers Vanilla) d/t decreased intake.  Marland Kitchen OMEPRAZOLE PO 20 MG CAPSULE: Give 1 capsule by mouth daily  . Polyethyl Glycol-Propyl Glycol (GENTEAL TEARS SEVERE DAY/NIGHT) 0.4-0.3 % GEL ophthalmic gel Place 1 application into both eyes every 12 (twelve) hours as needed. For  Dry Eyes  . polyethylene glycol (MIRALAX / GLYCOLAX) 17 g packet Take 17 g by mouth daily as needed.  . sertraline (ZOLOFT) 25 MG tablet Take 1 tablet (25 mg total) by mouth daily. Give 1 tablet by mouth daily for chronic pain and mood  . tamsulosin (FLOMAX) 0.4 MG CAPS capsule Take 0.4 mg by mouth daily.  Marland Kitchen torsemide (DEMADEX) 20 MG tablet Take 20 mg by mouth daily. FOR HTN/CKD   No facility-administered encounter medications on file as of 03/16/2020.    Review of Systems  Constitutional: Negative for chills and fever.  HENT: Negative for congestion and sore throat.   Eyes: Negative for blurred vision.       Glasses, patch  Respiratory: Positive for cough. Negative for sputum production and shortness of breath.   Cardiovascular: Negative for chest pain and palpitations.  Gastrointestinal: Negative for abdominal pain, blood in stool, constipation, diarrhea and melena.       Only small bm  Genitourinary: Negative for dysuria.   Musculoskeletal: Negative for falls, joint pain and myalgias.       Hip pain improving  Skin: Negative for itching and rash.  Neurological: Negative for loss of consciousness and headaches.  Psychiatric/Behavioral: Negative for depression and memory loss. The patient is not nervous/anxious and does not have insomnia.     Vitals:   03/16/20 1404  BP: 130/60  Pulse: 78  Resp: 18  Temp: 97.9 F (36.6 C)  Weight: 168 lb 12.8 oz (76.6 kg)  Height: 5\' 11"  (1.803 m)   Body mass index is 23.54 kg/m. Physical Exam Vitals reviewed.  Constitutional:      General: He is not in acute distress.    Appearance: Normal appearance. He is not toxic-appearing.  HENT:     Head: Normocephalic and atraumatic.     Right Ear: External ear normal.     Left Ear: External ear normal.     Nose: Nose normal.     Mouth/Throat:     Pharynx: Oropharynx is clear.  Eyes:     Conjunctiva/sclera: Conjunctivae normal.     Pupils: Pupils are equal, round, and reactive to light.     Comments: glasses  Cardiovascular:     Rate and Rhythm: Rhythm irregular.     Heart sounds: No murmur heard.   Pulmonary:     Effort: Pulmonary effort is normal.     Breath sounds: Normal breath sounds. No wheezing, rhonchi or rales.  Abdominal:     General: Bowel sounds are normal. There is no distension.     Tenderness: There is no abdominal tenderness. There is no guarding or rebound.  Musculoskeletal:     Cervical back: Neck supple.     Right lower leg: No edema.     Left lower leg: No edema.  Lymphadenopathy:     Cervical: No cervical adenopathy.  Skin:    Comments: Right hip incision site clean and dry, healing well  Neurological:     General: No focal deficit present.     Mental Status: He is alert and oriented to person, place, and time.     Cranial Nerves: No cranial nerve deficit.     Motor: No weakness.     Gait: Gait abnormal.     Comments: Using walker  Psychiatric:        Mood and Affect: Mood  normal.        Behavior: Behavior normal.        Thought Content: Thought content normal.  Judgment: Judgment normal.     Labs reviewed: Basic Metabolic Panel: Recent Labs    03/07/20 0000  NA 136*  K 4.3  CL 95*  CO2 30*  BUN 33*  CREATININE 1.1  CALCIUM 9.1   Liver Function Tests: No results for input(s): AST, ALT, ALKPHOS, BILITOT, PROT, ALBUMIN in the last 8760 hours. No results for input(s): LIPASE, AMYLASE in the last 8760 hours. No results for input(s): AMMONIA in the last 8760 hours. CBC: Recent Labs    03/07/20 0000  WBC 9.7  HGB 9.3*  HCT 29*  PLT 449*   Cardiac Enzymes: No results for input(s): CKTOTAL, CKMB, CKMBINDEX, TROPONINI in the last 8760 hours. BNP: Invalid input(s): POCBNP No results found for: HGBA1C No results found for: TSH No results found for: VITAMINB12 No results found for: FOLATE No results found for: IRON, TIBC, FERRITIN  Imaging and Procedures obtained prior to SNF admission: No results found.  Assessment/Plan 1. Drug-induced ileus ([place]) -resolved, but must be vigilant about watching that he's having bms regularly -intake fair and should start going regularly w/o opioids in his system -cont colace bid, miralax daily prn no bm  2. Severe protein-calorie malnutrition ([place]) -related to ileus -encourage po and supplements, small frequent intake until full appetite returns -cont remeron for this and depression related to recent medical issues  3. COVID-19 -only symptom reported is dry cough, cont enhanced droplet/contact precautions and monitor  4. Chronic kidney disease, stage 4 (severe) (HCC) -Avoid nephrotoxic agents like nsaids, dose adjust renally excreted meds, hydrate.  5. Status post total replacement of right hip -doing well with PT now that his ileus is resolved and off opioids -cont tylenol   6. Paroxysmal atrial fibrillation (HCC) -rate controlled w/o BB or CCB, cont pradaxa NOAC  7. Essential  hypertension -bp at goal, only on diuretic and his flomax for BPH  8. Gastroesophageal reflux disease without esophagitis -cont home omeprazole  9. Spinal stenosis of lumbar region with radiculopathy -cont home gabapentin therapy, tylenol prn  10. DNR (do not resuscitate) - Do not attempt resuscitation (DNR)  11.  Eczema:  On dupixent which should be on hold in view of covid (gets every 2 wks)--made him more susceptible to covid  Family/ staff Communication: d/w snf nurse  Labs/tests ordered:  No new added today  Braelin Costlow L. Jazarah Capili, D.O. [facility] [address], [place] [address] Cell Phone (Mon-Fri 8am-5pm):  [phone] On Call:  [phone] & follow prompts after 5pm & weekends Office Phone:  [phone] Office Fax:  [phone]

## 2020-03-17 ENCOUNTER — Encounter: Payer: Self-pay | Admitting: Internal Medicine

## 2020-03-21 LAB — BASIC METABOLIC PANEL
BUN: 29 — AB (ref 4–21)
CO2: 24 — AB (ref 13–22)
Chloride: 96 — AB (ref 99–108)
Creatinine: 1.1 (ref 0.6–1.3)
Glucose: 87
Potassium: 4.4 (ref 3.4–5.3)
Sodium: 129 — AB (ref 137–147)

## 2020-03-21 LAB — CBC AND DIFFERENTIAL
HCT: 29 — AB (ref 41–53)
Hemoglobin: 9.7 — AB (ref 13.5–17.5)
Platelets: 321 (ref 150–399)
WBC: 6.1

## 2020-03-21 LAB — CBC: RBC: 3.49 — AB (ref 3.87–5.11)

## 2020-03-21 LAB — COMPREHENSIVE METABOLIC PANEL
Calcium: 8.6 — AB (ref 8.7–10.7)
GFR calc Af Amer: 69.67
GFR calc non Af Amer: 30.12

## 2020-04-09 ENCOUNTER — Other Ambulatory Visit: Payer: Self-pay | Admitting: Orthopedic Surgery

## 2020-04-09 MED ORDER — TRAMADOL HCL 50 MG PO TABS: 50.0000 mg | ORAL_TABLET | Freq: Three times a day (TID) | ORAL | 0 refills | Status: AC | PRN

## 2020-04-11 ENCOUNTER — Encounter: Payer: Self-pay | Admitting: Orthopedic Surgery

## 2020-04-11 ENCOUNTER — Non-Acute Institutional Stay (SKILLED_NURSING_FACILITY): Payer: Medicare HMO | Admitting: Orthopedic Surgery

## 2020-04-11 DIAGNOSIS — R1084 Generalized abdominal pain: Secondary | ICD-10-CM

## 2020-04-11 DIAGNOSIS — R140 Abdominal distension (gaseous): Secondary | ICD-10-CM

## 2020-04-11 NOTE — Progress Notes (Signed)
Location:  [facility] Room Number: 505/P Place of Service:  SNF [phone]) Provider:  Windell Moulding, AGNP-C  Lowanda Foster, MD  Patient Care Team: Lowanda Foster, MD as PCP - General (Internal Medicine)  Extended Emergency Contact Information Primary Emergency Contact: Danielski,Gloria T Address: [address] [place], [place] [address] [place] Phone: [phone] Relation: Spouse  Code Status:  DNR Goals of care: Advanced Directive information Advanced Directives 03/15/2020  Does Patient Have a Medical Advance Directive? Yes  Type of Advance Directive Out of facility DNR (pink MOST or yellow form)  Does patient want to make changes to medical advance directive? No - Patient declined  Copy of [place] in Chart? -  Pre-existing out of facility DNR order (yellow form or pink MOST form) Yellow form placed in chart (order not valid for inpatient use)     Chief Complaint  Patient presents with  . Acute Visit    Abdominal pain    HPI:  Pt is a 85 y.o. male seen today for an acute visit for abdominal pain.   Facility nurse reports daughter concerned about fathers pain. She is requesting a Fentanyl patch and pain management consult. Facility nurse reports patient is refusing his Tramadol  due to upset stomach. After talking with daughter via telephone plan, plan to address fathers pain made.   Today, patient has just completed PT. States his pain is increased due to therapy session. He refused his pain medication this morning due to increased abdominal pain. Cannot describe pain, just says it hurts. He was hospitalized in early January from [facility] due to postoperative ileus. He is having regular bowel movements. Appetite fair.   Facility nurse does not report any other concerns, vitals stable.    Past Medical History:  Diagnosis Date  . A-fib ([place])   . Neuropathy    Past Surgical History:   Procedure Laterality Date  . APPENDECTOMY    . BACK SURGERY    . EYE SURGERY     detached retina  . NASAL RECONSTRUCTION    . PROSTATE SURGERY      Allergies  Allergen Reactions  . Amoxicillin-Pot Clavulanate Other (See Comments)  . Duloxetine Hcl Other (See Comments)  . Other Other (See Comments)  . Pregabalin Other (See Comments)  . Oxycodone Other (See Comments)    Outpatient Encounter Medications as of 04/11/2020  Medication Sig  . acetaminophen (TYLENOL) 500 MG tablet Take 500 mg by mouth every 6 (six) hours as needed.  Marland Kitchen alum & mag hydroxide-simeth (MAALOX PLUS) 400-400-40 MG/5ML suspension Take 30 mLs by mouth every 4 (four) hours as needed for indigestion.  . dabigatran (PRADAXA) 75 MG CAPS capsule Take 75 mg by mouth 2 (two) times daily. FOR AFIB  . docusate sodium (COLACE) 100 MG capsule Take 100 mg by mouth 2 (two) times daily.  . dupilumab (DUPIXENT) 200 MG/1.14ML prefilled syringe Inject 300 mg into the skin every 14 (fourteen) days. FOR ECZEMA  . gabapentin (NEURONTIN) 300 MG capsule Take 300 mg by mouth 2 (two) times daily. FOR PERIPHERAL NEUROPATHY  . mirtazapine (REMERON) 30 MG tablet Take 30 mg by mouth at bedtime.  . Multiple Vitamins-Minerals (ICAPS AREDS 2 PO) Take by mouth. Give 1 capsule by mouth every evening  . Nutritional Supplements (ENSURE PO) Take 1 Container by mouth in the morning and at bedtime. (prefers Vanilla) d/t decreased  intake.  Marland Kitchen OMEPRAZOLE PO 20 MG CAPSULE: Give 1 capsule by mouth daily  . Polyethyl Glycol-Propyl Glycol (GENTEAL TEARS SEVERE DAY/NIGHT) 0.4-0.3 % GEL ophthalmic gel Place 1 application into both eyes every 12 (twelve) hours as needed. For Dry Eyes  . polyethylene glycol (MIRALAX / GLYCOLAX) 17 g packet Take 17 g by mouth daily as needed.  . sertraline (ZOLOFT) 25 MG tablet Take 1 tablet (25 mg total) by mouth daily. Give 1 tablet by mouth daily for chronic pain and mood  . tamsulosin (FLOMAX) 0.4 MG CAPS capsule Take 0.4 mg by  mouth daily.  Marland Kitchen torsemide (DEMADEX) 20 MG tablet Take 20 mg by mouth daily. FOR HTN/CKD  . traMADol (ULTRAM) 50 MG tablet Take 1-2 tablets (50-100 mg total) by mouth every 8 (eight) hours as needed for up to 5 days for moderate pain or severe pain (Give one tablet for pain rated 4-6, give two tablets for pain rated 7-10).   No facility-administered encounter medications on file as of 04/11/2020.    Review of Systems  Constitutional: Negative for activity change, appetite change and fever.  Respiratory: Negative for cough, shortness of breath and wheezing.   Cardiovascular: Negative for chest pain and leg swelling.  Gastrointestinal: Positive for abdominal distention and abdominal pain. Negative for constipation, diarrhea and nausea.  Psychiatric/Behavioral: Negative for confusion and dysphoric mood. The patient is not nervous/anxious.     Immunization History  Administered Date(s) Administered  . Influenza Split 12/08/2017, 12/15/2017  . Influenza, High Dose Seasonal PF 12/15/2016, 05/05/2019, 02/08/2020  . Influenza,inj,Quad PF,6+ Mos 12/15/2017  . Influenza-Unspecified 01/22/2015, 12/08/2017  . Janssen (J&J) SARS-COV-2 Vaccination 07/14/2019  . Moderna Sars-Covid-2 Vaccination 01/26/2020  . Pneumococcal-Unspecified 12/21/2012  . Tdap 09/26/2017   Pertinent  Health Maintenance Due  Topic Date Due  . PNA vac Low Risk Adult (2 of 2 - PCV13) 12/21/2013  . INFLUENZA VACCINE  Completed   No flowsheet data found. Functional Status Survey:    Vitals:   04/11/20 1430  BP: 118/62  Pulse: 66  Temp: 98.9 F (37.2 C)  Weight: 164 lb (74.4 kg)   Body mass index is 22.87 kg/m. Physical Exam Vitals reviewed.  Constitutional:      General: He is not in acute distress. Cardiovascular:     Rate and Rhythm: Normal rate and regular rhythm.     Pulses: Normal pulses.     Heart sounds: Normal heart sounds. No murmur heard.   Pulmonary:     Effort: Pulmonary effort is normal. No  respiratory distress.     Breath sounds: Normal breath sounds. No wheezing.  Abdominal:     General: There is distension.     Tenderness: There is abdominal tenderness.     Comments: Hypoactive bowel sounds x 4. + flatus.   Neurological:     General: No focal deficit present.     Mental Status: He is alert and oriented to person, place, and time.     Motor: Weakness present.     Gait: Gait abnormal.     Comments: walker  Psychiatric:        Mood and Affect: Mood normal.        Behavior: Behavior normal.     Labs reviewed: Recent Labs    03/07/20 0000  NA 136*  K 4.3  CL 95*  CO2 30*  BUN 33*  CREATININE 1.1  CALCIUM 9.1   No results for input(s): AST, ALT, ALKPHOS, BILITOT, PROT, ALBUMIN in the last 8760  hours. Recent Labs    03/07/20 0000  WBC 9.7  HGB 9.3*  HCT 29*  PLT 449*   No results found for: TSH No results found for: HGBA1C No results found for: CHOL, HDL, LDLCALC, LDLDIRECT, TRIG, CHOLHDL  Significant Diagnostic Results in last 30 days:  No results found.  Assessment/Plan 1. Generalized abdominal pain - abdomen distended, tenderness LLQ/RLQ,no nausea, facility nurse reports regular BM  - concerned for ileus - KUB abdomen - increase miralax to BID x 2 days - advise minimal use of tramadol - will decrease tramadol to 50 mg po Q8rs prn - will increase tylenol to 1000 mg po bid for pain - will start probiotic po daily  2. Abdominal distention - same as above    Family/ staff Communication: Plan discussed with patient, daughter, and facility nurse  Labs/tests ordered:  KUB abdomen

## 2020-04-18 ENCOUNTER — Non-Acute Institutional Stay (SKILLED_NURSING_FACILITY): Payer: Medicare HMO | Admitting: Orthopedic Surgery

## 2020-04-18 ENCOUNTER — Encounter: Payer: Self-pay | Admitting: Orthopedic Surgery

## 2020-04-18 DIAGNOSIS — N184 Chronic kidney disease, stage 4 (severe): Secondary | ICD-10-CM

## 2020-04-18 DIAGNOSIS — E43 Unspecified severe protein-calorie malnutrition: Secondary | ICD-10-CM

## 2020-04-18 DIAGNOSIS — R140 Abdominal distension (gaseous): Secondary | ICD-10-CM

## 2020-04-18 DIAGNOSIS — Z96641 Presence of right artificial hip joint: Secondary | ICD-10-CM

## 2020-04-18 DIAGNOSIS — I480 Paroxysmal atrial fibrillation: Secondary | ICD-10-CM | POA: Diagnosis not present

## 2020-04-18 DIAGNOSIS — I10 Essential (primary) hypertension: Secondary | ICD-10-CM

## 2020-04-18 DIAGNOSIS — K219 Gastro-esophageal reflux disease without esophagitis: Secondary | ICD-10-CM

## 2020-04-18 NOTE — Progress Notes (Signed)
Location:    [facility] Room Number: 505/P Place of Service:  SNF [phone]) Provider: Windell Moulding NP   Lowanda Foster, MD  Patient Care Team: Lowanda Foster, MD as PCP - General (Internal Medicine)  Extended Emergency Contact Information Primary Emergency Contact: Kober,Gloria T Address: [address] [place], [place] [address] [place] Phone: [phone] Relation: Spouse  Code Status:  DNR Goals of care: Advanced Directive information Advanced Directives 04/18/2020  Does Patient Have a Medical Advance Directive? Yes  Type of Advance Directive Out of facility DNR (pink MOST or yellow form)  Does patient want to make changes to medical advance directive? No - Patient declined  Copy of [place] in Chart? -  Pre-existing out of facility DNR order (yellow form or pink MOST form) Yellow form placed in chart (order not valid for inpatient use)     Chief Complaint  Patient presents with  . Medical Management of Chronic Issues    Routine Visit of Medical Management     HPI:  Pt is a 85 y.o. male seen today for medical management of chronic diseases.    He is a resident of [facility], seen today in room. PMH includes: hypertension, a-fib, GERD, peripheral neuropathy, eczema, CKD stage IV, urinary retention, constipation, falls, and vitamin D deficiency.   Today, he is sitting in his wheelchair finishing a phone call with friend. He is alert and oriented x 4. Follows commands and can express needs. Upset his right leg remains painful. He is scared to take narcotics due to postoperative ileus in 03/2018. Also has chronic right shoulder pain. Claims his orthopedic issues make it hard for him to sit up. Continues to use wheelchair for ambulation, wishes he could graduate to walker. Also complains of stiffness in AM. 02/02 KUB done for abdominal distension and pain, results  negative. Last BM today, states movements regular. Appetite fair. Mainly upset with his daughter. He is overwhelmed by all her calls, and upset she hangs up on him daily. Her behavior has given him added stress. In addition, he is trying to figure out if he needs to sell his house. Does not think he can live there and knows he needs additional care. He is also asking for [employer] transportation tomorrow to see Dr. Tamala Julian, coordinator notified. He denies chest pain, or sob.   No recent falls or injuries.   Recent blood pressures are as follows:  02/09- 121/67  02/08- 107/64  02/07- 119/83  Recent weights are as follows:   02/03- 167 lbs  01/13- 164.1 lbs  Facility nurse does not report any concerns, vitals stable.        Past Medical History:  Diagnosis Date  . A-fib ([place])   . Neuropathy    Past Surgical History:  Procedure Laterality Date  . APPENDECTOMY    . BACK SURGERY    . EYE SURGERY     detached retina  . NASAL RECONSTRUCTION    . PROSTATE SURGERY      Allergies  Allergen Reactions  . Amoxicillin-Pot Clavulanate Other (See Comments)  . Duloxetine Hcl Other (See Comments)  . Other Other (See Comments)  . Pregabalin Other (See Comments)  . Oxycodone Other (See Comments)    Allergies as of 04/18/2020      Reactions   Amoxicillin-pot Clavulanate Other (See Comments)   Duloxetine Hcl  Other (See Comments)   Other Other (See Comments)   Pregabalin Other (See Comments)   Oxycodone Other (See Comments)      Medication List       Accurate as of April 18, 2020  3:44 PM. If you have any questions, ask your nurse or doctor.        acetaminophen 500 MG tablet Commonly known as: TYLENOL Take 1,000 mg by mouth 2 (two) times daily.   alum & mag hydroxide-simeth [id number] MG/5ML suspension Commonly known as: MAALOX PLUS Take 30 mLs by mouth every 4 (four) hours as needed for indigestion.   dabigatran 75 MG Caps capsule Commonly known as: PRADAXA Take 75 mg by  mouth 2 (two) times daily. FOR AFIB   docusate sodium 100 MG capsule Commonly known as: COLACE Take 100 mg by mouth 2 (two) times daily.   dupilumab 200 MG/1.14ML prefilled syringe Commonly known as: DUPIXENT Inject 300 mg into the skin every 14 (fourteen) days. FOR ECZEMA   ENSURE PO Take 1 Container by mouth in the morning and at bedtime. (prefers Vanilla) d/t decreased intake.   gabapentin 300 MG capsule Commonly known as: NEURONTIN Take 300 mg by mouth 2 (two) times daily. FOR PERIPHERAL NEUROPATHY   ICAPS AREDS 2 PO Take by mouth. Give 1 capsule by mouth every evening   methocarbamol 500 MG tablet Commonly known as: ROBAXIN Take 500 mg by mouth 2 (two) times daily as needed for muscle spasms.   methocarbamol 500 MG tablet Commonly known as: ROBAXIN Take 500 mg by mouth 2 (two) times daily. For 5 days for muscle tightness   mirtazapine 30 MG tablet Commonly known as: REMERON Take 30 mg by mouth at bedtime.   OMEPRAZOLE PO 20 MG CAPSULE: Give 1 capsule by mouth daily   polyethylene glycol 17 g packet Commonly known as: MIRALAX / GLYCOLAX Take 17 g by mouth daily as needed.   Probiotic 250 MG Caps Take 1 capsule by mouth daily. For [place]   sertraline 25 MG tablet Commonly known as: ZOLOFT Take 1 tablet (25 mg total) by mouth daily. Give 1 tablet by mouth daily for chronic pain and mood   tamsulosin 0.4 MG Caps capsule Commonly known as: FLOMAX Take 0.4 mg by mouth daily.   tobramycin-dexamethasone ophthalmic ointment Commonly known as: TOBRADEX Place 1 application into both eyes 2 (two) times daily as needed.   torsemide 20 MG tablet Commonly known as: DEMADEX Take 20 mg by mouth daily. FOR HTN/CKD       Review of Systems  Constitutional: Negative for activity change, appetite change and fever.  HENT: Positive for hearing loss. Negative for congestion, dental problem and trouble swallowing.   Eyes: Negative for visual disturbance.       Glasses   Respiratory: Negative for cough, shortness of breath and wheezing.   Cardiovascular: Positive for leg swelling. Negative for chest pain.  Gastrointestinal: Negative for abdominal pain, constipation, diarrhea and nausea.       Recent ileus  Genitourinary: Negative for dysuria, frequency and hematuria.  Musculoskeletal: Positive for arthralgias and myalgias.       Right hip and shoulder pain  Skin:       Dry skin  Neurological: Positive for weakness. Negative for dizziness and headaches.  Psychiatric/Behavioral: Negative for confusion, dysphoric mood and sleep disturbance. The patient is not nervous/anxious.     Immunization History  Administered Date(s) Administered  . Influenza, High Dose Seasonal PF 12/15/2016, 05/05/2019, 02/08/2020  . Influenza,inj,Quad  PF,6+ Mos 12/15/2017  . Influenza-Unspecified 01/22/2015, 12/08/2017  . Janssen (J&J) SARS-COV-2 Vaccination 07/14/2019  . Moderna Sars-Covid-2 Vaccination 01/26/2020  . Pneumococcal-Unspecified 12/21/2012  . Tdap 09/26/2017   Pertinent  Health Maintenance Due  Topic Date Due  . PNA vac Low Risk Adult (2 of 2 - PCV13) 12/21/2013  . INFLUENZA VACCINE  Completed   No flowsheet data found. Functional Status Survey:    Vitals:   04/18/20 1539  BP: 100/60  Pulse: 68  Resp: 18  Temp: 98 F (36.7 C)  Weight: 167 lb (75.8 kg)  Height: '5\' 11"'$  (1.803 m)   Body mass index is 23.29 kg/m. Physical Exam Vitals reviewed.  Constitutional:      General: He is not in acute distress. HENT:     Head: Normocephalic.     Right Ear: There is no impacted cerumen.     Left Ear: There is no impacted cerumen.     Nose: Nose normal.     Mouth/Throat:     Mouth: Mucous membranes are moist.  Eyes:     General:        Right eye: No discharge.        Left eye: No discharge.  Cardiovascular:     Rate and Rhythm: Normal rate. Rhythm irregular.     Pulses: Normal pulses.     Heart sounds: Normal heart sounds. No murmur  heard.   Pulmonary:     Effort: Pulmonary effort is normal. No respiratory distress.     Breath sounds: Normal breath sounds. No wheezing.  Abdominal:     General: Bowel sounds are normal. There is distension.     Palpations: Abdomen is soft.     Tenderness: There is no abdominal tenderness.     Hernia: A hernia is present.  Musculoskeletal:     Cervical back: Normal range of motion.     Right lower leg: Edema present.     Left lower leg: Edema present.     Comments: Non-pitting  Lymphadenopathy:     Cervical: No cervical adenopathy.  Skin:    General: Skin is warm and dry.     Capillary Refill: Capillary refill takes less than 2 seconds.  Neurological:     General: No focal deficit present.     Mental Status: He is alert and oriented to person, place, and time.     Motor: Weakness present.     Gait: Gait abnormal.     Comments: wheelchair  Psychiatric:        Mood and Affect: Mood normal.        Behavior: Behavior normal.     Labs reviewed: Recent Labs    03/07/20 0000 03/21/20 0000  NA 136* 129*  K 4.3 4.4  CL 95* 96*  CO2 30* 24*  BUN 33* 29*  CREATININE 1.1 1.1  CALCIUM 9.1 8.6*   No results for input(s): AST, ALT, ALKPHOS, BILITOT, PROT, ALBUMIN in the last 8760 hours. Recent Labs    03/07/20 0000 03/21/20 0000  WBC 9.7 6.1  HGB 9.3* 9.7*  HCT 29* 29*  PLT 449* 321   No results found for: TSH No results found for: HGBA1C No results found for: CHOL, HDL, LDLCALC, LDLDIRECT, TRIG, CHOLHDL  Significant Diagnostic Results in last 30 days:  No results found.  Assessment/Plan 1. Abdominal distention - still appears distended, having regular BM - cont colace and miralax - will add prune juice in AM  - continue to avoid narcotics  2.  Chronic kidney disease, stage 4 (severe) (HCC) - continue to avoid nephrotoxic drugs like NSAIDS and dose adjust medications to be renally excreted  3. Status post total replacement of right hip - slow progression,  has trouble standing due to shoulder pain - he also c/o stiffness in AM - will add robaxin 500 mg po bid x 5 days then bid prn for muscle spasms ans stiffness- recommend giving with tylenol in AM - cont PT/OT  4. Paroxysmal atrial fibrillation (HCC) - rate controlled, no meds - cont pradaxa for dvt prophylaxis  5. Essential hypertension - bp at goal < 150/90 - cont torsemide - cont to limit sodium in diet < 2000 mg/day  6. Gastroesophageal reflux disease without esophagitis - stable with omperazole  7. Severe protein-calorie malnutrition ([facility]) - he has had 3 lbs weight gain in past month - cont ensure bid     Family/ staff Communication: plan discussed with patient and facility nurse   Labs/tests ordered:  none

## 2020-04-26 ENCOUNTER — Telehealth: Payer: Self-pay | Admitting: Family Medicine

## 2020-04-26 NOTE — Telephone Encounter (Signed)
Pt called asking our office to request the medical records regarding his back and hip from [facility] office.   [facility] Fax# [phone] * Needs to include pt name DOB and which records we're needing

## 2020-04-27 ENCOUNTER — Telehealth: Payer: Self-pay | Admitting: Family Medicine

## 2020-04-27 NOTE — Telephone Encounter (Signed)
Request for records faxed

## 2020-04-27 NOTE — Telephone Encounter (Signed)
Spoke with pts daughter. Advised I did fax a request for records to Dr. Thompson Caul office this am. And I re-faxed just as we spoke. Fax number confirmed. [phone]. Daughter # [phone]

## 2020-04-30 ENCOUNTER — Encounter: Payer: Self-pay | Admitting: Orthopedic Surgery

## 2020-04-30 ENCOUNTER — Non-Acute Institutional Stay (SKILLED_NURSING_FACILITY): Payer: Medicare HMO | Admitting: Orthopedic Surgery

## 2020-04-30 DIAGNOSIS — J45909 Unspecified asthma, uncomplicated: Secondary | ICD-10-CM

## 2020-04-30 DIAGNOSIS — N184 Chronic kidney disease, stage 4 (severe): Secondary | ICD-10-CM | POA: Diagnosis not present

## 2020-04-30 DIAGNOSIS — U071 COVID-19: Secondary | ICD-10-CM

## 2020-04-30 DIAGNOSIS — Z96641 Presence of right artificial hip joint: Secondary | ICD-10-CM | POA: Diagnosis not present

## 2020-04-30 DIAGNOSIS — R339 Retention of urine, unspecified: Secondary | ICD-10-CM

## 2020-04-30 DIAGNOSIS — I10 Essential (primary) hypertension: Secondary | ICD-10-CM

## 2020-04-30 DIAGNOSIS — E43 Unspecified severe protein-calorie malnutrition: Secondary | ICD-10-CM

## 2020-04-30 DIAGNOSIS — I480 Paroxysmal atrial fibrillation: Secondary | ICD-10-CM | POA: Diagnosis not present

## 2020-04-30 DIAGNOSIS — K219 Gastro-esophageal reflux disease without esophagitis: Secondary | ICD-10-CM

## 2020-04-30 DIAGNOSIS — H5789 Other specified disorders of eye and adnexa: Secondary | ICD-10-CM

## 2020-04-30 DIAGNOSIS — F325 Major depressive disorder, single episode, in full remission: Secondary | ICD-10-CM

## 2020-04-30 NOTE — Progress Notes (Signed)
Location:    [facility] Room Number: 505/P Place of Service:  SNF [phone])  Provider: Windell Moulding NP  PCP: Lowanda Foster, MD Patient Care Team: Lowanda Foster, MD as PCP - General (Internal Medicine)  Extended Emergency Contact Information Primary Emergency Contact: Delaughter,Gloria T Address: [address] [place], [place] [address] [place] Phone: [phone] Relation: Spouse  Code Status: DNR Goals of care:  Advanced Directive information Advanced Directives 04/30/2020  Does Patient Have a Medical Advance Directive? Yes  Type of Advance Directive Out of facility DNR (pink MOST or yellow form)  Does patient want to make changes to medical advance directive? No - Patient declined  Copy of [place] in Chart? -  Pre-existing out of facility DNR order (yellow form or pink MOST form) Yellow form placed in chart (order not valid for inpatient use)     Allergies  Allergen Reactions  . Amoxicillin-Pot Clavulanate Other (See Comments)  . Duloxetine Hcl Other (See Comments)  . Other Other (See Comments)  . Pregabalin Other (See Comments)  . Oxycodone Other (See Comments)    Chief Complaint  Patient presents with  . Discharge Note    Discharge Visit     HPI:  85 y.o. male seen today for discharge evaluation.   He is a resident of [facility], seen today in room. PMH includes: hypertension, a-fib, GERD, peripheral neuropathy, eczema, CKD stage IV, urinary retention, constipation, falls, and vitamin D deficiency.   12/21 he underwent right total hip arthroplasty by Dr. Tamala Julian at [facility]. He was discharged to [facility] 12/23 for skilled nursing services and PT. 12/31 he began having increased abdominal pain and was taken to [facility]. Admitted with suspected ileus. He was made NPO and NG tube placed for decompression.01/03 EGD done to r/o splenic  laceration found on CT. No laceration found, abdominal pain thought to be from opioid induced ileus. Narcotic pain medications withheld. Returned to [facility] 01/05.   When he returned to [facility] he tested positive for covid-19 and was placed on isolation precautions. Removed from isolation 01/18.   Today, he is alert and oriented x 4. Follows commands and can express needs. He is aware about being discharged to [facility] later this week. Upset his right hip remains painful. Daughter has scheduled appointment with another provider for second opinion on hip pain. He has most difficulty sitting to standing. Taking tylenol and robaxin prn for pain and spasms. Ambulating about 150-200 ft with rollator. Bowel movements 3-4 times a week. He denies chest pain, shortness of breath or calf pain at this time.   No recent falls or injuries.   Recent blood pressures are as follows:  02/21- 132/70  02/20- 117/70  02/19- 108/59  Recent weights are as follows:  02/22- 176.6 lbs  02/03- 167 lbs  01/06- 168.8 lbs  Facility nurse does not report any concerns, vitals stable.   At this time he plans to discharge to [facility] 02/23. Home health PT/OT have been ordered. Semi-electric hospital bed with half-rails ordered. I have advised him to follow up with PCP in 1-2 weeks for follow up and lab work.    Past Medical History:  Diagnosis Date  . A-fib ([place])   . Neuropathy     Past Surgical History:  Procedure Laterality Date  . APPENDECTOMY    . BACK SURGERY    .  EYE SURGERY     detached retina  . NASAL RECONSTRUCTION    . PROSTATE SURGERY        reports that he has never smoked. He has never used smokeless tobacco. He reports current alcohol use of about 2.0 standard drinks of alcohol per week. He reports that he does not use drugs. Social History   Socioeconomic History  . Marital status: Married    Spouse name: Not on file  . Number of children: Not on file  . Years of education: Not on file  .  Highest education level: Not on file  Occupational History  . Not on file  Tobacco Use  . Smoking status: Never Smoker  . Smokeless tobacco: Never Used  Substance and Sexual Activity  . Alcohol use: Yes    Alcohol/week: 2.0 standard drinks    Types: 2 Glasses of wine per week    Comment: daily  . Drug use: No  . Sexual activity: Not Currently    Partners: Male  Other Topics Concern  . Not on file  Social History Narrative  . Not on file   Social Determinants of Health   Financial Resource Strain: Not on file  Food Insecurity: Not on file  Transportation Needs: Not on file  Physical Activity: Not on file  Stress: Not on file  Social Connections: Not on file  Intimate Partner Violence: Not on file   Functional Status Survey:    Allergies  Allergen Reactions  . Amoxicillin-Pot Clavulanate Other (See Comments)  . Duloxetine Hcl Other (See Comments)  . Other Other (See Comments)  . Pregabalin Other (See Comments)  . Oxycodone Other (See Comments)    Pertinent  Health Maintenance Due  Topic Date Due  . PNA vac Low Risk Adult (2 of 2 - PCV13) 12/21/2013  . INFLUENZA VACCINE  Completed    Medications: Allergies as of 04/30/2020      Reactions   Amoxicillin-pot Clavulanate Other (See Comments)   Duloxetine Hcl Other (See Comments)   Other Other (See Comments)   Pregabalin Other (See Comments)   Oxycodone Other (See Comments)      Medication List       Accurate as of April 30, 2020  3:03 PM. If you have any questions, ask your nurse or doctor.        acetaminophen 500 MG tablet Commonly known as: TYLENOL Take 1,000 mg by mouth 2 (two) times daily.   alum & mag hydroxide-simeth [id number] MG/5ML suspension Commonly known as: MAALOX PLUS Take 30 mLs by mouth every 4 (four) hours as needed for indigestion.   dabigatran 75 MG Caps capsule Commonly known as: PRADAXA Take 75 mg by mouth 2 (two) times daily. FOR AFIB   docusate sodium 100 MG  capsule Commonly known as: COLACE Take 100 mg by mouth 2 (two) times daily.   dupilumab 200 MG/1.14ML prefilled syringe Commonly known as: DUPIXENT Inject 300 mg into the skin every 14 (fourteen) days. FOR ECZEMA   ENSURE PO Take 1 Container by mouth in the morning and at bedtime. (prefers Vanilla) d/t decreased intake.   gabapentin 300 MG capsule Commonly known as: NEURONTIN Take 300 mg by mouth 2 (two) times daily. FOR PERIPHERAL NEUROPATHY   ICAPS AREDS 2 PO Take by mouth. Give 1 capsule by mouth every evening   methocarbamol 500 MG tablet Commonly known as: ROBAXIN Take 500 mg by mouth 3 (three) times daily. For muscle tightness What changed: Another medication with the same name  was removed. Continue taking this medication, and follow the directions you see here. Changed by: Yvonna Alanis, NP   mirtazapine 30 MG tablet Commonly known as: REMERON Take 30 mg by mouth at bedtime.   OMEPRAZOLE PO 20 MG CAPSULE: Give 1 capsule by mouth daily   polyethylene glycol 17 g packet Commonly known as: MIRALAX / GLYCOLAX Take 17 g by mouth daily as needed.   Probiotic 250 MG Caps Take 1 capsule by mouth daily. For [place]   sertraline 25 MG tablet Commonly known as: ZOLOFT Take 1 tablet (25 mg total) by mouth daily. Give 1 tablet by mouth daily for chronic pain and mood   tamsulosin 0.4 MG Caps capsule Commonly known as: FLOMAX Take 0.4 mg by mouth daily.   tobramycin-dexamethasone ophthalmic ointment Commonly known as: TOBRADEX Place 1 application into both eyes 2 (two) times daily as needed.   torsemide 20 MG tablet Commonly known as: DEMADEX Take 20 mg by mouth daily. FOR HTN/CKD       Review of Systems  Constitutional: Negative for activity change, appetite change and fatigue.  HENT: Positive for hearing loss. Negative for congestion and trouble swallowing.   Eyes: Negative for visual disturbance.       Glasses  Respiratory: Negative for cough, shortness of  breath and wheezing.   Cardiovascular: Negative for chest pain and leg swelling.  Gastrointestinal: Positive for abdominal distention and abdominal pain. Negative for constipation, diarrhea and nausea.  Genitourinary: Negative for dysuria, frequency and hematuria.  Musculoskeletal: Positive for arthralgias, gait problem and myalgias.       Right hip pain, right shoulder pain  Skin:       Dry skin  Neurological: Positive for weakness. Negative for dizziness and headaches.  Hematological: Bruises/bleeds easily.  Psychiatric/Behavioral: Negative for confusion, dysphoric mood and sleep disturbance. The patient is not nervous/anxious.     Vitals:   04/30/20 1502  BP: 132/70  Pulse: 74  Resp: 18  Temp: (!) 97.3 F (36.3 C)  Weight: 174 lb 1.6 oz (79 kg)  Height: '5\' 11"'$  (1.803 m)   Body mass index is 24.28 kg/m. Physical Exam Vitals reviewed.  Constitutional:      General: He is not in acute distress. HENT:     Head: Normocephalic.     Comments: HOH    Right Ear: There is no impacted cerumen.     Left Ear: There is no impacted cerumen.     Nose: Nose normal.     Mouth/Throat:     Mouth: Mucous membranes are moist.  Eyes:     General:        Right eye: No discharge.        Left eye: No discharge.  Cardiovascular:     Rate and Rhythm: Normal rate. Rhythm irregular.     Pulses: Normal pulses.     Heart sounds: Normal heart sounds.  Pulmonary:     Effort: Pulmonary effort is normal. No respiratory distress.     Breath sounds: Normal breath sounds. No wheezing.  Musculoskeletal:     Cervical back: Normal range of motion.     Right lower leg: No edema.     Left lower leg: No edema.     Comments: Right hip surgical incision closed. No drainage. Surrounding skin intact.   Lymphadenopathy:     Cervical: No cervical adenopathy.  Skin:    General: Skin is warm and dry.     Capillary Refill: Capillary refill takes less  than 2 seconds.  Neurological:     General: No focal  deficit present.     Mental Status: He is alert and oriented to person, place, and time.     Motor: Weakness present.     Gait: Gait abnormal.     Comments: Wheelchair/rollator  Psychiatric:        Mood and Affect: Mood normal.        Behavior: Behavior normal.     Labs reviewed: Basic Metabolic Panel: Recent Labs    03/07/20 0000 03/21/20 0000  NA 136* 129*  K 4.3 4.4  CL 95* 96*  CO2 30* 24*  BUN 33* 29*  CREATININE 1.1 1.1  CALCIUM 9.1 8.6*   Liver Function Tests: No results for input(s): AST, ALT, ALKPHOS, BILITOT, PROT, ALBUMIN in the last 8760 hours. No results for input(s): LIPASE, AMYLASE in the last 8760 hours. No results for input(s): AMMONIA in the last 8760 hours. CBC: Recent Labs    03/07/20 0000 03/21/20 0000  WBC 9.7 6.1  HGB 9.3* 9.7*  HCT 29* 29*  PLT 449* 321   Cardiac Enzymes: No results for input(s): CKTOTAL, CKMB, CKMBINDEX, TROPONINI in the last 8760 hours. BNP: Invalid input(s): POCBNP CBG: No results for input(s): GLUCAP in the last 8760 hours.  Procedures and Imaging Studies During Stay: No results found.  Assessment/Plan:   1. Chronic kidney disease, stage 4 (severe) (HCC) - continue to avoid nephrotoxic drugs like NSAIDS and dose adjust medications to be renally excreted - encourage hydration - cont torsemide - bmp- future  2. Status post total replacement of right hip - right hip pain ongoing, plans to see another provider for second opinion - remains WBAT, ambulating about 150-200 feet - cont tylenol and robaxin prn for pain - home health PT/OT  3. Paroxysmal atrial fibrillation (HCC) - rate controlled without medication - cont pradaxa for clot prevention  4. Essential hypertension - bp at goal < 150/90 - cont torsemide - cont to limit sodium in diet - cbc/diff- future  5. Gastroesophageal reflux disease without esophagitis - stable with omeprazole  6. Severe protein-calorie malnutrition ([place]) - gained about 8  lbs since stay - cont ensure   7. COVID-19 - resolved, no long term symptoms - covid booster in 01/2020    Patient is being discharged with the following home health services: PT/OT   Patient is being discharged with the following durable medical equipment: Semi electric hospital bed with half-rails  Patient has been advised to f/u with their PCP in 1-2 weeks to bring them up to date on their rehab stay.  Social services at facility was responsible for arranging this appointment.  Pt was provided with a 30 day supply of prescriptions for medications and refills must be obtained from their PCP.  For controlled substances, a more limited supply may be provided adequate until PCP appointment only.  Future labs/tests needed:  Cbc/diff, bmp

## 2020-05-01 MED ORDER — MIRTAZAPINE 30 MG PO TABS: 30.0000 mg | ORAL_TABLET | Freq: Every day | ORAL | 0 refills | Status: DC

## 2020-05-01 MED ORDER — GABAPENTIN 300 MG PO CAPS: 300.0000 mg | ORAL_CAPSULE | Freq: Two times a day (BID) | ORAL | 0 refills | Status: DC

## 2020-05-01 MED ORDER — TAMSULOSIN HCL 0.4 MG PO CAPS: 0.4000 mg | ORAL_CAPSULE | Freq: Every day | ORAL | 0 refills | Status: DC

## 2020-05-01 MED ORDER — DABIGATRAN ETEXILATE MESYLATE 75 MG PO CAPS: 75.0000 mg | ORAL_CAPSULE | Freq: Two times a day (BID) | ORAL | 0 refills | Status: DC

## 2020-05-01 MED ORDER — DUPILUMAB 200 MG/1.14ML ~~LOC~~ SOSY: 300.0000 mg | PREFILLED_SYRINGE | SUBCUTANEOUS | 0 refills | Status: DC

## 2020-05-01 MED ORDER — TOBRAMYCIN-DEXAMETHASONE 0.3-0.1 % OP OINT: 1.0000 "application " | TOPICAL_OINTMENT | Freq: Two times a day (BID) | OPHTHALMIC | 0 refills | Status: DC | PRN

## 2020-05-01 MED ORDER — METHOCARBAMOL 500 MG PO TABS: 500.0000 mg | ORAL_TABLET | Freq: Three times a day (TID) | ORAL | 0 refills | Status: DC

## 2020-05-01 MED ORDER — TORSEMIDE 20 MG PO TABS: 20.0000 mg | ORAL_TABLET | Freq: Every day | ORAL | 0 refills | Status: DC

## 2020-05-01 MED ORDER — OMEPRAZOLE 20 MG PO CPDR: 20.0000 mg | DELAYED_RELEASE_CAPSULE | Freq: Every day | ORAL | 0 refills | Status: DC

## 2020-05-01 MED ORDER — SERTRALINE HCL 25 MG PO TABS: 25.0000 mg | ORAL_TABLET | Freq: Every day | ORAL | 0 refills | Status: DC

## 2020-05-01 MED ORDER — PROBIOTIC 250 MG PO CAPS: 1.0000 | ORAL_CAPSULE | Freq: Every day | ORAL | 0 refills | Status: DC

## 2020-05-02 ENCOUNTER — Ambulatory Visit: Payer: Medicare HMO | Admitting: Family Medicine

## 2020-05-09 ENCOUNTER — Encounter: Payer: Self-pay | Admitting: Orthopaedic Surgery

## 2020-05-09 ENCOUNTER — Ambulatory Visit (INDEPENDENT_AMBULATORY_CARE_PROVIDER_SITE_OTHER): Payer: Medicare HMO

## 2020-05-09 ENCOUNTER — Ambulatory Visit (INDEPENDENT_AMBULATORY_CARE_PROVIDER_SITE_OTHER): Payer: Medicare HMO | Admitting: Orthopaedic Surgery

## 2020-05-09 DIAGNOSIS — M79604 Pain in right leg: Secondary | ICD-10-CM

## 2020-05-09 DIAGNOSIS — M25551 Pain in right hip: Secondary | ICD-10-CM

## 2020-05-09 DIAGNOSIS — Z96641 Presence of right artificial hip joint: Secondary | ICD-10-CM | POA: Diagnosis not present

## 2020-05-09 NOTE — Progress Notes (Signed)
Office Visit Note   Patient: Gabriel Ayala           Date of Birth: 05/26/1935           MRN: [id number] Visit Date: 05/09/2020              Requested by: Lowanda Foster, [id number] [place],  [place] [address] PCP: Lowanda Foster, MD   Assessment & Plan: Visit Diagnoses:  1. Pain in right hip   2. Pain in right leg   3. History of total hip replacement, right     Plan: I did try to validate the patient's right hip pain postoperative that he is feeling.  Some people certainly can get over hip replacement surgery quicker and some can.  I did not see any complicating features at all from the hip replacement itself and it looks appropriate.  I did recommend the facility continue to work on aggressive physical therapy to strengthen both hips and his core and work on his balance and coordination.  I have also recommended Robaxin-750 milligrams every 6 hours as needed for pain and spasms.  Follow-up can be as needed.  We can always MRI of the lumbar spine if needed.  Follow-Up Instructions: Return if symptoms worsen or fail to improve.   Orders:  Orders Placed This Encounter  Procedures  . XR HIP UNILAT W OR W/O PELVIS 1V RIGHT  . XR Lumbar Spine 2-3 Views   No orders of the defined types were placed in this encounter.     Procedures: No procedures performed   Clinical Data: No additional findings.   Subjective: Chief Complaint  Patient presents with  . Right Hip - Pain  The patient is an 85 year old gentleman who comes in today with family to evaluate and treat right hip pain.  He actually is less than 3 months out from a right total hip arthroplasty that was done in [facility].  He still has pain around the right hip since surgery and is very frustrated.  He does have known arthritis in his left hip and has had some spine issues over time.  He said the pain is severe at night.  He says the pain makes him nauseous.  He was on  Robaxin when he was in rehab but he is not on Robaxin at the facility that he is at now.  Most of the patient's pain seems to be around his right hip and in his thigh.  HPI  Review of Systems There is currently listed no headache, chest pain, short of breath, fever, chills, nausea vomiting  Objective: Vital Signs: There were no vitals taken for this visit.  Physical Exam  he is alert and oriented and follows commands appropriately.  I was able to assess his right hip incision and it looks like it is an anterior hip incision.  There is no evidence of infection.  The right hip moves smoothly and fluidly and it does have pain that is appropriate based on what I am seeing for someone under 883 months postoperative. Ortho Exam  Specialty Comments:  No specialty comments available.  Imaging: No results found.   PMFS History: Patient Active Problem List   Diagnosis Date Noted  . Status post total replacement of right hip 03/07/2020  . Gastroesophageal reflux disease without esophagitis 03/07/2020  . Chronic kidney disease, stage 4 (severe) ([place]) 03/07/2020  . Spinal stenosis of lumbar region with radiculopathy 08/07/2018  .  Peripheral neuropathy 08/07/2018  . Recurrent falls 08/07/2018  . Essential hypertension 08/07/2018  . Paroxysmal atrial fibrillation ([place]) 08/07/2018  . Urinary retention 08/07/2018  . Atopic eczema 08/07/2018  . Constipation 08/07/2018  . Vitamin D deficiency 08/07/2018   Past Medical History:  Diagnosis Date  . A-fib ([place])   . Neuropathy     History reviewed. No pertinent family history.  Past Surgical History:  Procedure Laterality Date  . APPENDECTOMY    . BACK SURGERY    . EYE SURGERY     detached retina  . NASAL RECONSTRUCTION    . PROSTATE SURGERY     Social History   Occupational History  . Not on file  Tobacco Use  . Smoking status: Never Smoker  . Smokeless tobacco: Never Used  Substance and Sexual Activity  . Alcohol use: Yes     Alcohol/week: 2.0 standard drinks    Types: 2 Glasses of wine per week    Comment: daily  . Drug use: No  . Sexual activity: Not Currently    Partners: Male

## 2021-01-01 ENCOUNTER — Encounter (HOSPITAL_BASED_OUTPATIENT_CLINIC_OR_DEPARTMENT_OTHER): Payer: Self-pay | Admitting: *Deleted

## 2021-01-01 ENCOUNTER — Emergency Department (HOSPITAL_BASED_OUTPATIENT_CLINIC_OR_DEPARTMENT_OTHER): Payer: Medicare HMO

## 2021-01-01 ENCOUNTER — Emergency Department (HOSPITAL_BASED_OUTPATIENT_CLINIC_OR_DEPARTMENT_OTHER)
Admission: EM | Admit: 2021-01-01 | Discharge: 2021-01-02 | Disposition: A | Discharge status: Home / Self Care | Payer: Medicare HMO | Attending: Emergency Medicine | Admitting: Emergency Medicine

## 2021-01-01 ENCOUNTER — Other Ambulatory Visit: Payer: Self-pay

## 2021-01-01 DIAGNOSIS — Z96641 Presence of right artificial hip joint: Secondary | ICD-10-CM | POA: Diagnosis not present

## 2021-01-01 DIAGNOSIS — N184 Chronic kidney disease, stage 4 (severe): Secondary | ICD-10-CM | POA: Insufficient documentation

## 2021-01-01 DIAGNOSIS — Z7901 Long term (current) use of anticoagulants: Secondary | ICD-10-CM | POA: Insufficient documentation

## 2021-01-01 DIAGNOSIS — G8929 Other chronic pain: Secondary | ICD-10-CM | POA: Insufficient documentation

## 2021-01-01 DIAGNOSIS — M1612 Unilateral primary osteoarthritis, left hip: Secondary | ICD-10-CM | POA: Insufficient documentation

## 2021-01-01 DIAGNOSIS — I129 Hypertensive chronic kidney disease with stage 1 through stage 4 chronic kidney disease, or unspecified chronic kidney disease: Secondary | ICD-10-CM | POA: Diagnosis not present

## 2021-01-01 DIAGNOSIS — Z79899 Other long term (current) drug therapy: Secondary | ICD-10-CM | POA: Insufficient documentation

## 2021-01-01 DIAGNOSIS — M25552 Pain in left hip: Secondary | ICD-10-CM | POA: Diagnosis present

## 2021-01-01 MED ORDER — METHOCARBAMOL 500 MG PO TABS: 500.0000 mg | ORAL_TABLET | Freq: Three times a day (TID) | ORAL | 0 refills | Status: DC

## 2021-01-01 MED ORDER — ONDANSETRON 4 MG PO TBDP
4.0000 mg | ORAL_TABLET | Freq: Once | ORAL | Status: AC
  Administered 2021-01-01: 4 mg via ORAL
  Filled 2021-01-01: qty 1

## 2021-01-01 MED ORDER — HYDROCODONE-ACETAMINOPHEN 5-325 MG PO TABS
2.0000 | ORAL_TABLET | ORAL | Status: DC | PRN
  Administered 2021-01-01: 2 via ORAL
  Filled 2021-01-01: qty 2

## 2021-01-01 MED ORDER — HYDROMORPHONE HCL 2 MG PO TABS: 2.0000 mg | ORAL_TABLET | ORAL | Status: DC | PRN

## 2021-01-01 MED ORDER — HYDROMORPHONE HCL 1 MG/ML IJ SOLN
1.0000 mg | Freq: Once | INTRAMUSCULAR | Status: AC
  Administered 2021-01-01: 1 mg via INTRAMUSCULAR
  Filled 2021-01-01: qty 1

## 2021-01-01 NOTE — ED Provider Notes (Signed)
[facility] EMERGENCY DEPARTMENT Provider Note   CSN: [id number] Arrival date & time: 01/01/21  1120     History Chief Complaint  Patient presents with   Hip Pain    Gabriel Ayala is a 85 y.o. male.  Presents to ER with concern for hip pain.  Patient reports that he is struggled with hip pain for a long time, underwent hip replacement about 10 months ago on his right side.  More recently has been having increased pain in his left hip.  Has been ongoing for the past few weeks, but has had difficulty getting follow-up with his orthopedic specialist.  States that he has undergone joint injections previously which have temporarily helped.  Pain is worse with certain movements, improved with rest.  Pain at rest right now is minimal.  Previously today states that the pain was severe, sharp and stabbing.  Isolated to left hip at present.  Denies any further leg pain or leg swelling. No back pain. No recent falls or trauma.   On Pradaxa for A. fib.  HPI     Past Medical History:  Diagnosis Date   A-fib [facility]    Neuropathy     Patient Active Problem List   Diagnosis Date Noted   Status post total replacement of right hip 03/07/2020   Gastroesophageal reflux disease without esophagitis 03/07/2020   Chronic kidney disease, stage 4 (severe) ([place]) 03/07/2020   Spinal stenosis of lumbar region with radiculopathy 08/07/2018   Peripheral neuropathy 08/07/2018   Recurrent falls 08/07/2018   Essential hypertension 08/07/2018   Paroxysmal atrial fibrillation ([place]) 08/07/2018   Urinary retention 08/07/2018   Atopic eczema 08/07/2018   Constipation 08/07/2018   Vitamin D deficiency 08/07/2018    Past Surgical History:  Procedure Laterality Date   APPENDECTOMY     BACK SURGERY     EYE SURGERY     detached retina   NASAL RECONSTRUCTION     PROSTATE SURGERY         No family history on file.  Social History   Tobacco Use   Smoking status: Never   Smokeless tobacco:  Never  Substance Use Topics   Alcohol use: Yes    Alcohol/week: 2.0 standard drinks    Types: 2 Glasses of wine per week    Comment: daily   Drug use: No    Home Medications Prior to Admission medications   Medication Sig Start Date End Date Taking? Authorizing Provider  acetaminophen (TYLENOL) 500 MG tablet Take 1,000 mg by mouth 2 (two) times daily.    [provider]  alum & mag hydroxide-simeth (MAALOX PLUS) 400-400-40 MG/5ML suspension Take 30 mLs by mouth every 4 (four) hours as needed for indigestion.    [provider]  dabigatran (PRADAXA) 75 MG CAPS capsule Take 1 capsule (75 mg total) by mouth 2 (two) times daily. FOR AFIB 05/01/20   Fargo, Amy E, NP  docusate sodium (COLACE) 100 MG capsule Take 100 mg by mouth 2 (two) times daily.    [provider]  dupilumab (DUPIXENT) 200 MG/1.14ML prefilled syringe Inject 300 mg into the skin every 14 (fourteen) days. FOR ECZEMA 05/01/20   Fargo, Amy E, NP  gabapentin (NEURONTIN) 300 MG capsule Take 1 capsule (300 mg total) by mouth 2 (two) times daily. FOR PERIPHERAL NEUROPATHY 05/01/20   Fargo, Amy E, NP  methocarbamol (ROBAXIN) 500 MG tablet Take 1 tablet (500 mg total) by mouth 3 (three) times daily. For muscle tightness 01/01/21  Caryl Ada K, PA-C  mirtazapine (REMERON) 30 MG tablet Take 1 tablet (30 mg total) by mouth at bedtime. 05/01/20   Fargo, Amy E, NP  Multiple Vitamins-Minerals (ICAPS AREDS 2 PO) Take by mouth. Give 1 capsule by mouth every evening    [provider]  Nutritional Supplements (ENSURE PO) Take 1 Container by mouth in the morning and at bedtime. (prefers Vanilla) d/t decreased intake. 03/05/20   [provider]  omeprazole (PRILOSEC) 20 MG capsule Take 1 capsule (20 mg total) by mouth daily. 20 MG CAPSULE: Give 1 capsule by mouth daily 05/01/20   Fargo, Amy E, NP  polyethylene glycol (MIRALAX / GLYCOLAX) 17 g packet Take 17 g by mouth daily as needed.    [provider]  Saccharomyces boulardii (PROBIOTIC) 250 MG CAPS Take 1 capsule by mouth daily. For [place] 05/01/20   Yvonna Alanis, NP  sertraline (ZOLOFT) 25 MG tablet Take 1 tablet (25 mg total) by mouth daily. Give 1 tablet by mouth daily for chronic pain and mood 05/01/20   Fargo, Amy E, NP  tamsulosin (FLOMAX) 0.4 MG CAPS capsule Take 1 capsule (0.4 mg total) by mouth daily. 05/01/20   Fargo, Amy E, NP  tobramycin-dexamethasone (TOBRADEX) ophthalmic ointment Place 1 application into both eyes 2 (two) times daily as needed. 05/01/20   Fargo, Amy E, NP  torsemide (DEMADEX) 20 MG tablet Take 1 tablet (20 mg total) by mouth daily. FOR HTN/CKD 05/01/20   Yvonna Alanis, NP    Allergies    Amoxicillin-pot clavulanate, Duloxetine hcl, Other, Pregabalin, and Oxycodone  Review of Systems   Review of Systems  Constitutional:  Negative for chills and fever.  HENT:  Negative for ear pain and sore throat.   Eyes:  Negative for pain and visual disturbance.  Respiratory:  Negative for cough and shortness of breath.   Cardiovascular:  Negative for chest pain and palpitations.  Gastrointestinal:  Negative for abdominal pain and vomiting.  Genitourinary:  Negative for dysuria and hematuria.  Musculoskeletal:  Positive for arthralgias. Negative for back pain.  Skin:  Negative for color change and rash.  Neurological:  Negative for seizures and syncope.  All other systems reviewed and are negative.  Physical Exam Updated Vital Signs BP 111/63   Pulse 66   Temp 98.1 F (36.7 C) (Oral)   Resp 16   Ht 6' (1.829 m)   Wt 81.6 kg   SpO2 99%   BMI 24.41 kg/m   Physical Exam Vitals and nursing note reviewed.  Constitutional:      Appearance: He is well-developed.  HENT:     Head: Normocephalic and atraumatic.  Eyes:     Conjunctiva/sclera: Conjunctivae normal.  Cardiovascular:     Rate and Rhythm: Normal rate and regular rhythm.     Heart sounds: No murmur heard. Pulmonary:     Effort:  Pulmonary effort is normal. No respiratory distress.     Breath sounds: Normal breath sounds.  Abdominal:     Palpations: Abdomen is soft.     Tenderness: There is no abdominal tenderness.  Musculoskeletal:     Cervical back: Neck supple.     Comments: LLE: TTP to L hip, normal hip ROM, no TTP or deformity noted throughout remainder of extremity, normal motor, sensation and pulses distally  Skin:    General: Skin is warm and dry.  Neurological:     General: No focal deficit present.     Mental Status: He is  alert.     Comments: Normal strength/sensation in l/e    ED Results / Procedures / Treatments   Labs (all labs ordered are listed, but only abnormal results are displayed) Labs Reviewed - No data to display  EKG None  Radiology DG Hip Unilat W or Wo Pelvis 2-3 Views Left  Result Date: 01/01/2021 CLINICAL DATA:  Bilateral hip pain. Status post right total hip arthroplasty 10 months ago. Patient started having left hip pain 3-4 weeks ago. EXAM: DG HIP (WITH OR WITHOUT PELVIS) 2-3V LEFT COMPARISON:  CT AP 03/11/2020 FINDINGS: Stable postoperative appearance of the right hip status post total arthroplasty. No sign periprosthetic fracture or subluxation. The left hip appears located and intact. There is moderate joint space narrowing with subchondral sclerosis and marginal spur formation. Surgical clips are identified within both sides of pelvis. Scoliosis and lumbar degenerative disc disease. IMPRESSION: 1. Moderate left hip osteoarthritis. 2. Status post right hip arthroplasty Electronically Signed   By: Kerby Moors M.D.   On: 01/01/2021 13:22    Procedures Procedures   Medications Ordered in ED Medications  HYDROmorphone (DILAUDID) injection 1 mg (1 mg Intramuscular Given 01/01/21 1339)  ondansetron (ZOFRAN-ODT) disintegrating tablet 4 mg (4 mg Oral Given 01/01/21 1339)    ED Course  I have reviewed the triage vital signs and the nursing notes.  Pertinent labs & imaging  results that were available during my care of the patient were reviewed by me and considered in my medical decision making (see chart for details).    MDM Rules/Calculators/A&P                           85 year old male presents to ER with concern for left hip pain.  Has history of chronic hip pain, osteoarthritis, has undergone right hip replacement.  Denies falls or trauma today.  On exam noted to have tenderness in the left hip but no obvious deformity or significant swelling appreciated.  Neurovascularly intact.  Plain films negative for acute pathology.  Suspect related to his osteoarthritis.  Symptoms resolved after receiving single dose of pain medication.  Believe he is stable for discharge back to his assisted living facility.  He was able to ambulate in department.  Recommended he follow-up with his orthopedic specialist.  After the discussed management above, the patient was determined to be safe for discharge.  The patient was in agreement with this plan and all questions regarding their care were answered.  ED return precautions were discussed and the patient will return to the ED with any significant worsening of condition.  Final Clinical Impression(s) / ED Diagnoses Final diagnoses:  Osteoarthritis of left hip, unspecified osteoarthritis type    Rx / DC Orders ED Discharge Orders          Ordered    methocarbamol (ROBAXIN) 500 MG tablet  3 times daily,   Status:  Discontinued        01/01/21 1504    methocarbamol (ROBAXIN) 500 MG tablet  3 times daily        01/01/21 1539             Lucrezia Starch, MD 01/01/21 2025

## 2021-01-01 NOTE — ED Notes (Signed)
Spoke to [employer]	[place] at [facility].  Explained to Roderic Palau that pt was concerned about pain and walking.  Pt wanting to see ortho MD.  He relates that there are caregivers available 24/7 to assist pt to bathroom if needed.    Roderic Palau expressed that the patient has Robaxin PRN.  Prescription being sent home for Robaxin scheduled for 20 doses.  Roderic Palau relates that he will assist patient with following up with ortho.

## 2021-01-01 NOTE — ED Notes (Addendum)
Assumed care from [facility]. Patient sitting quietly up in chair. No acute distress noted. Patient updated on plan of care. Will continue to monitor.  2015: Patient ambulating around halls - Patient instructed to stay in room - Patient ambulates with walker without incident.   2036: Patient given more juice per request - states he is having more pain- MD made aware. Orders received.  2054: Dilaudid po is not available in pyxis - MD made aware.   2209: Patient medicated per orders and assisted to lay down in bed. Patient tolerated well. Awaiting PTAR - Patient verbalized understanding. Will continue to monitor.   2245: Patient resting quietly in gurney - intermittently nodding off.     2305: Patient resting quietly - report given to [facility] for continuation of care.

## 2021-01-01 NOTE — Discharge Instructions (Addendum)
Recommend follow-up with both your primary doctor and your orthopedic surgeon.  Please call your orthopedic specialist today to request an appointment this week.  Can try the muscle relaxer as needed.

## 2021-01-01 NOTE — ED Notes (Signed)
[facility] called for transport.  Pt was placed on the wait list.

## 2021-01-01 NOTE — ED Triage Notes (Signed)
Arrived ems w c/o bilateral hip pain  had rt hip replacement 10 months ago  has had pain since,  past 3-4 weeks left hip pain,   states was able to walk around yesterday but pain is so bad unable to walk today

## 2021-01-02 NOTE — ED Notes (Signed)
[facility] at facility for transport 

## 2022-01-31 ENCOUNTER — Emergency Department (HOSPITAL_BASED_OUTPATIENT_CLINIC_OR_DEPARTMENT_OTHER): Payer: Medicare HMO

## 2022-01-31 ENCOUNTER — Emergency Department (HOSPITAL_BASED_OUTPATIENT_CLINIC_OR_DEPARTMENT_OTHER)
Admission: EM | Admit: 2022-01-31 | Discharge: 2022-02-01 | Disposition: A | Discharge status: Skilled Nursing Facility | Payer: Medicare HMO | Attending: Emergency Medicine | Admitting: Emergency Medicine

## 2022-01-31 ENCOUNTER — Encounter (HOSPITAL_BASED_OUTPATIENT_CLINIC_OR_DEPARTMENT_OTHER): Payer: Self-pay

## 2022-01-31 ENCOUNTER — Other Ambulatory Visit: Payer: Self-pay

## 2022-01-31 DIAGNOSIS — R1310 Dysphagia, unspecified: Secondary | ICD-10-CM | POA: Diagnosis present

## 2022-01-31 DIAGNOSIS — R059 Cough, unspecified: Secondary | ICD-10-CM | POA: Insufficient documentation

## 2022-01-31 DIAGNOSIS — Z20822 Contact with and (suspected) exposure to covid-19: Secondary | ICD-10-CM | POA: Insufficient documentation

## 2022-01-31 LAB — BASIC METABOLIC PANEL
Anion gap: 10 (ref 5–15)
BUN: 52 mg/dL — ABNORMAL HIGH (ref 8–23)
CO2: 28 mmol/L (ref 22–32)
Calcium: 9 mg/dL (ref 8.9–10.3)
Chloride: 96 mmol/L — ABNORMAL LOW (ref 98–111)
Creatinine, Ser: 1.18 mg/dL (ref 0.61–1.24)
GFR, Estimated: >60 mL/min (ref >60)
Glucose, Bld: 107 mg/dL — ABNORMAL HIGH (ref 70–99)
Potassium: 4.1 mmol/L (ref 3.5–5.1)
Sodium: 134 mmol/L — ABNORMAL LOW (ref 135–145)

## 2022-01-31 LAB — CBC WITH DIFFERENTIAL/PLATELET
Abs Immature Granulocytes: 0.09 10*3/uL — ABNORMAL HIGH (ref 0.00–0.07)
Basophils Absolute: 0.1 10*3/uL (ref 0.0–0.1)
Basophils Relative: 1 %
Eosinophils Absolute: 0.1 10*3/uL (ref 0.0–0.5)
Eosinophils Relative: 1 %
HCT: 31.9 % — ABNORMAL LOW (ref 39.0–52.0)
Hemoglobin: 10.5 g/dL — ABNORMAL LOW (ref 13.0–17.0)
Immature Granulocytes: 1 %
Lymphocytes Relative: 9 %
Lymphs Abs: 0.8 10*3/uL (ref 0.7–4.0)
MCH: 28.6 pg (ref 26.0–34.0)
MCHC: 32.9 g/dL (ref 30.0–36.0)
MCV: 86.9 fL (ref 80.0–100.0)
Monocytes Absolute: 1.3 10*3/uL — ABNORMAL HIGH (ref 0.1–1.0)
Monocytes Relative: 14 %
Neutro Abs: 6.7 10*3/uL (ref 1.7–7.7)
Neutrophils Relative %: 74 %
Platelets: 286 10*3/uL (ref 150–400)
RBC: 3.67 MIL/uL — ABNORMAL LOW (ref 4.22–5.81)
RDW: 14 % (ref 11.5–15.5)
WBC: 9 10*3/uL (ref 4.0–10.5)
nRBC: 0 % (ref 0.0–0.2)

## 2022-01-31 LAB — RESP PANEL BY RT-PCR (FLU A&B, COVID) ARPGX2
Influenza A by PCR: NEGATIVE
Influenza B by PCR: NEGATIVE
SARS Coronavirus 2 by RT PCR: NEGATIVE

## 2022-01-31 MED ORDER — LORAZEPAM 2 MG/ML IJ SOLN
0.5000 mg | Freq: Once | INTRAMUSCULAR | Status: AC
  Administered 2022-01-31: 0.5 mg via INTRAVENOUS
  Filled 2022-01-31: qty 1

## 2022-01-31 MED ORDER — MORPHINE SULFATE (PF) 4 MG/ML IV SOLN
4.0000 mg | Freq: Once | INTRAVENOUS | Status: AC
  Administered 2022-01-31: 4 mg via INTRAVENOUS
  Filled 2022-01-31: qty 1

## 2022-01-31 NOTE — ED Notes (Signed)
Dr Mesner 

## 2022-01-31 NOTE — ED Triage Notes (Signed)
Pt complaining of having shortness of breath, started coughing 40 min ago. Is from [facility]

## 2022-01-31 NOTE — ED Provider Notes (Signed)
[facility] Provider Note   CSN: [id number] Arrival date & time: 01/31/22  2153     History  Chief Complaint  Patient presents with   Cough    Gabriel Ayala is a 86 y.o. male.  86 year old male the presents from assisted living secondary to cough.  Apparently the patient has a history of oropharyngeal dysphagia.  He states he has a history of coughing and having difficulty swallowing that goes back for a long time but got bad again tonight.  He also has some right hip pain that is chronic.  No fevers.  Not coughing up.  No other associated symptoms.  Tried to call wife but couldn't get through with given phone number.    Cough      Home Medications Prior to Admission medications   Medication Sig Start Date End Date Taking? Authorizing Provider  acetaminophen (TYLENOL) 500 MG tablet Take 1,000 mg by mouth 2 (two) times daily.    [provider]  alum & mag hydroxide-simeth (MAALOX PLUS) 400-400-40 MG/5ML suspension Take 30 mLs by mouth every 4 (four) hours as needed for indigestion.    [provider]  dabigatran (PRADAXA) 75 MG CAPS capsule Take 1 capsule (75 mg total) by mouth 2 (two) times daily. FOR AFIB 05/01/20   Fargo, Amy E, NP  docusate sodium (COLACE) 100 MG capsule Take 100 mg by mouth 2 (two) times daily.    [provider]  dupilumab (DUPIXENT) 200 MG/1.14ML prefilled syringe Inject 300 mg into the skin every 14 (fourteen) days. FOR ECZEMA 05/01/20   Fargo, Amy E, NP  gabapentin (NEURONTIN) 300 MG capsule Take 1 capsule (300 mg total) by mouth 2 (two) times daily. FOR PERIPHERAL NEUROPATHY 05/01/20   Fargo, Amy E, NP  methocarbamol (ROBAXIN) 500 MG tablet Take 1 tablet (500 mg total) by mouth 3 (three) times daily. For muscle tightness 01/01/21   Caryl Ada K, PA-C  mirtazapine (REMERON) 30 MG tablet Take 1 tablet (30 mg total) by mouth at bedtime. 05/01/20   Fargo, Amy E, NP  Multiple Vitamins-Minerals  (ICAPS AREDS 2 PO) Take by mouth. Give 1 capsule by mouth every evening    [provider]  Nutritional Supplements (ENSURE PO) Take 1 Container by mouth in the morning and at bedtime. (prefers Vanilla) d/t decreased intake. 03/05/20   [provider]  omeprazole (PRILOSEC) 20 MG capsule Take 1 capsule (20 mg total) by mouth daily. 20 MG CAPSULE: Give 1 capsule by mouth daily 05/01/20   Fargo, Amy E, NP  polyethylene glycol (MIRALAX / GLYCOLAX) 17 g packet Take 17 g by mouth daily as needed.    [provider]  Saccharomyces boulardii (PROBIOTIC) 250 MG CAPS Take 1 capsule by mouth daily. For [place] [facility] 05/01/20   Yvonna Alanis, NP  sertraline (ZOLOFT) 25 MG tablet Take 1 tablet (25 mg total) by mouth daily. Give 1 tablet by mouth daily for chronic pain and mood 05/01/20   Fargo, Amy E, NP  tamsulosin (FLOMAX) 0.4 MG CAPS capsule Take 1 capsule (0.4 mg total) by mouth daily. 05/01/20   Fargo, Amy E, NP  tobramycin-dexamethasone (TOBRADEX) ophthalmic ointment Place 1 application into both eyes 2 (two) times daily as needed. 05/01/20   Fargo, Amy E, NP  torsemide (DEMADEX) 20 MG tablet Take 1 tablet (20 mg total) by mouth daily. FOR HTN/CKD 05/01/20   Yvonna Alanis, NP      Allergies    Amoxicillin-pot clavulanate,  Duloxetine hcl, Other, Pregabalin, and Oxycodone    Review of Systems   Review of Systems  Respiratory:  Positive for cough.     Physical Exam Updated Vital Signs BP (!) 107/56   Pulse 66   Temp 98.2 F (36.8 C)   Resp 16   SpO2 98%  Physical Exam Vitals and nursing note reviewed.  Constitutional:      Appearance: He is well-developed.  HENT:     Head: Normocephalic and atraumatic.     Mouth/Throat:     Mouth: Mucous membranes are moist.     Pharynx: Oropharynx is clear.  Eyes:     Pupils: Pupils are equal, round, and reactive to light.  Cardiovascular:     Rate and Rhythm: Normal rate. Rhythm irregular.  Pulmonary:     Effort: Pulmonary effort is  normal. No respiratory distress.     Comments: Patient persistently coughing and suctioning his own saliva while I am in the room.  Abdominal:     General: There is no distension.     Comments: Rectus diastasis  Musculoskeletal:        General: Normal range of motion.     Cervical back: Normal range of motion.  Skin:    General: Skin is warm and dry.  Neurological:     Mental Status: He is alert.     ED Results / Procedures / Treatments   Labs (all labs ordered are listed, but only abnormal results are displayed) Labs Reviewed  CBC WITH DIFFERENTIAL/PLATELET - Abnormal; Notable for the following components:      Result Value   RBC 3.67 (*)    Hemoglobin 10.5 (*)    HCT 31.9 (*)    Monocytes Absolute 1.3 (*)    Abs Immature Granulocytes 0.09 (*)    All other components within normal limits  BASIC METABOLIC PANEL - Abnormal; Notable for the following components:   Sodium 134 (*)    Chloride 96 (*)    Glucose, Bld 107 (*)    BUN 52 (*)    All other components within normal limits  RESP PANEL BY RT-PCR (FLU A&B, COVID) [id number]    EKG EKG Interpretation  Date/Time:  Friday January 31 2022 23:54:57 EST Ventricular Rate:  63 PR Interval:    QRS Duration: 105 QT Interval:  448 QTC Calculation: 459 R Axis:   -82 Text Interpretation: Atrial fibrillation Left anterior fascicular block When compared with ECG of 01/01/2021, No significant change was found Confirmed by Delora Fuel ([id number]) on 02/01/2022 12:51:16 AM  Radiology DG Chest Port 1 View  Result Date: 01/31/2022 CLINICAL DATA:  Shortness of breath and cough for several minutes, initial encounter EXAM: PORTABLE CHEST 1 VIEW COMPARISON:  10/24/2021 FINDINGS: Cardiac shadow is enlarged but stable. Mitral clip and left atrial appendage occlusion device are seen and stable. Aortic calcifications are noted. The lungs are well aerated bilaterally. Small effusions are seen left greater than right increased from the prior exam.  No new focal infiltrate is seen. IMPRESSION: Small effusions bilaterally left greater than right but decreased when compared with the prior exam. Electronically Signed   By: Inez Catalina M.D.   On: 01/31/2022 22:47    Procedures Procedures    Medications Ordered in ED Medications  LORazepam (ATIVAN) injection 0.5 mg (0.5 mg Intravenous Given 01/31/22 2328)  morphine (PF) 4 MG/ML injection 4 mg (4 mg Intravenous Given 01/31/22 2327)  pantoprazole (PROTONIX) injection 40 mg (40 mg Intravenous Given 02/01/22 0022)  alum &  mag hydroxide-simeth (MAALOX/MYLANTA) 200-200-20 MG/5ML suspension 15 mL (15 mLs Oral Given 02/01/22 0022)    ED Course/ Medical Decision Making/ A&P                           Medical Decision Making Amount and/or Complexity of Data Reviewed Labs: ordered.  Risk OTC drugs. Prescription drug management.  Suspect it's his dysphagia that is a problem. Not sure what he normally takes to make it better but will give dose of ativan as that is on his med list and pain meds. Will attempt PO afterwards to see if improvement. Patient observed for multiple hours after meds and tolerating secretions without coughing. Tolerated maalox. Doubt esophageal impaction. Doubt any acute causes for dysphagia exacerbation. No indication for further workup or hospitalization at this time.   Final Clinical Impression(s) / ED Diagnoses Final diagnoses:  Dysphagia, unspecified type    Rx / DC Orders ED Discharge Orders     None         Jasin Brazel, Corene Cornea, MD 02/01/22 [phone]

## 2022-02-01 MED ORDER — PANTOPRAZOLE SODIUM 40 MG IV SOLR
40.0000 mg | Freq: Once | INTRAVENOUS | Status: AC
  Administered 2022-02-01: 40 mg via INTRAVENOUS
  Filled 2022-02-01: qty 10

## 2022-02-01 MED ORDER — ALUM & MAG HYDROXIDE-SIMETH 200-200-20 MG/5ML PO SUSP
15.0000 mL | Freq: Once | ORAL | Status: AC
  Administered 2022-02-01: 15 mL via ORAL
  Filled 2022-02-01: qty 30

## 2022-02-01 NOTE — ED Notes (Signed)
Attempted to call report x 1  

## 2022-02-01 NOTE — ED Notes (Signed)
PTAR is here for patient and attempted to call report x3

## 2022-02-01 NOTE — ED Notes (Signed)
Attempted to call report x2

## 2022-05-24 ENCOUNTER — Encounter (HOSPITAL_BASED_OUTPATIENT_CLINIC_OR_DEPARTMENT_OTHER): Payer: Self-pay | Admitting: Emergency Medicine

## 2022-05-24 ENCOUNTER — Emergency Department (HOSPITAL_BASED_OUTPATIENT_CLINIC_OR_DEPARTMENT_OTHER)
Admission: EM | Admit: 2022-05-24 | Discharge: 2022-05-25 | Disposition: A | Discharge status: Skilled Nursing Facility | Payer: Medicare HMO | Attending: Emergency Medicine | Admitting: Emergency Medicine

## 2022-05-24 DIAGNOSIS — I509 Heart failure, unspecified: Secondary | ICD-10-CM | POA: Insufficient documentation

## 2022-05-24 DIAGNOSIS — R040 Epistaxis: Secondary | ICD-10-CM

## 2022-05-24 DIAGNOSIS — I130 Hypertensive heart and chronic kidney disease with heart failure and stage 1 through stage 4 chronic kidney disease, or unspecified chronic kidney disease: Secondary | ICD-10-CM | POA: Insufficient documentation

## 2022-05-24 DIAGNOSIS — N189 Chronic kidney disease, unspecified: Secondary | ICD-10-CM | POA: Diagnosis not present

## 2022-05-24 DIAGNOSIS — Z79899 Other long term (current) drug therapy: Secondary | ICD-10-CM | POA: Insufficient documentation

## 2022-05-24 HISTORY — DX: Heart failure, unspecified: I50.9

## 2022-05-24 MED ORDER — HYDROCODONE-ACETAMINOPHEN 5-325 MG PO TABS
1.0000 | ORAL_TABLET | Freq: Once | ORAL | Status: AC
  Administered 2022-05-24: 1 via ORAL
  Filled 2022-05-24: qty 1

## 2022-05-24 MED ORDER — HYDROCODONE-ACETAMINOPHEN 5-325 MG PO TABS: 1.0000 | ORAL_TABLET | ORAL | 0 refills | Status: DC | PRN

## 2022-05-24 MED ORDER — TRANEXAMIC ACID-NACL 1000-0.7 MG/100ML-% IV SOLN
INTRAVENOUS | Status: AC
  Filled 2022-05-24: qty 100

## 2022-05-24 MED ORDER — TRAMADOL HCL 50 MG PO TABS
50.0000 mg | ORAL_TABLET | Freq: Once | ORAL | Status: AC
  Administered 2022-05-24: 50 mg via ORAL
  Filled 2022-05-24: qty 1

## 2022-05-24 MED ORDER — OXYMETAZOLINE HCL 0.05 % NA SOLN
2.0000 | Freq: Once | NASAL | Status: AC
  Administered 2022-05-24: 2 via NASAL
  Filled 2022-05-24: qty 30

## 2022-05-24 MED ORDER — ACETAMINOPHEN 325 MG PO TABS
650.0000 mg | ORAL_TABLET | Freq: Once | ORAL | Status: AC
  Administered 2022-05-24: 650 mg via ORAL
  Filled 2022-05-24: qty 2

## 2022-05-24 MED ORDER — TRANEXAMIC ACID 1000 MG/10ML IV SOLN
500.0000 mg | Freq: Once | INTRAVENOUS | Status: AC
  Administered 2022-05-25: 500 mg via TOPICAL
  Filled 2022-05-24: qty 10

## 2022-05-24 NOTE — ED Notes (Signed)
EDP to bedside after pt alerted RN that he was "about to pull the thing out of his nose".

## 2022-05-24 NOTE — ED Notes (Signed)
Provided patient with cranberry juice, as per their request.

## 2022-05-24 NOTE — Discharge Instructions (Signed)
Your history, exam, and evaluation today are consistent with a posterior right-sided nosebleed.  As you are on blood thinners it bled significantly.  We tried multiple techniques and ended up using a inflatable nasal packing that will remain in place until you see ENT in the next 2 to 3 days.  Please use the pain medicine to help with the discomfort.  Please discuss with your primary doctor about stopping the blood thinners as you report it is going to be stopped next week anyway.  Please rest and stay hydrated.  If any symptoms change or worsen acutely, or starts bleeding again, please return to the nearest emergency department however I would recommend going to [facility] which is where ENT is most easily able to see you in consultation if needed.

## 2022-05-24 NOTE — ED Notes (Signed)
PTAR called for transport home. 

## 2022-05-24 NOTE — ED Notes (Addendum)
Changed pt clothes, changed bed and put him into recliner, for muscle cramping in legs, Pt feels much better since adjustments

## 2022-05-24 NOTE — ED Provider Notes (Signed)
[place] [facility] Provider Note   CSN: [id number] Arrival date & time: 05/24/22  1825     History  Chief Complaint  Patient presents with   Epistaxis    Gabriel Ayala is a 87 y.o. male.  The history is provided by the patient and medical records. No language interpreter was used.  Epistaxis Location:  R nare Severity:  Severe Duration:  2 hours Timing:  Constant Progression:  Resolved Chronicity:  New Context: anticoagulants   Relieved by:  Nothing Worsened by:  Nothing Ineffective treatments:  None tried Associated symptoms: blood in oropharynx (dried and not appearing active bleeding)   Associated symptoms: no congestion, no cough, no fever and no headaches   Risk factors: no frequent nosebleeds        Home Medications Prior to Admission medications   Medication Sig Start Date End Date Taking? Authorizing Provider  acetaminophen (TYLENOL) 500 MG tablet Take 1,000 mg by mouth 2 (two) times daily.    [provider]  alum & mag hydroxide-simeth (MAALOX PLUS) 400-400-40 MG/5ML suspension Take 30 mLs by mouth every 4 (four) hours as needed for indigestion.    [provider]  dabigatran (PRADAXA) 75 MG CAPS capsule Take 1 capsule (75 mg total) by mouth 2 (two) times daily. FOR AFIB 05/01/20   Fargo, Amy E, NP  docusate sodium (COLACE) 100 MG capsule Take 100 mg by mouth 2 (two) times daily.    [provider]  dupilumab (DUPIXENT) 200 MG/1.14ML prefilled syringe Inject 300 mg into the skin every 14 (fourteen) days. FOR ECZEMA 05/01/20   Fargo, Amy E, NP  gabapentin (NEURONTIN) 300 MG capsule Take 1 capsule (300 mg total) by mouth 2 (two) times daily. FOR PERIPHERAL NEUROPATHY 05/01/20   Fargo, Amy E, NP  methocarbamol (ROBAXIN) 500 MG tablet Take 1 tablet (500 mg total) by mouth 3 (three) times daily. For muscle tightness 01/01/21   Caryl Ada K, PA-C  mirtazapine (REMERON) 30 MG tablet Take 1 tablet (30  mg total) by mouth at bedtime. 05/01/20   Fargo, Amy E, NP  Multiple Vitamins-Minerals (ICAPS AREDS 2 PO) Take by mouth. Give 1 capsule by mouth every evening    [provider]  Nutritional Supplements (ENSURE PO) Take 1 Container by mouth in the morning and at bedtime. (prefers Vanilla) d/t decreased intake. 03/05/20   [provider]  omeprazole (PRILOSEC) 20 MG capsule Take 1 capsule (20 mg total) by mouth daily. 20 MG CAPSULE: Give 1 capsule by mouth daily 05/01/20   Fargo, Amy E, NP  polyethylene glycol (MIRALAX / GLYCOLAX) 17 g packet Take 17 g by mouth daily as needed.    [provider]  Saccharomyces boulardii (PROBIOTIC) 250 MG CAPS Take 1 capsule by mouth daily. For [place] 05/01/20   Yvonna Alanis, NP  sertraline (ZOLOFT) 25 MG tablet Take 1 tablet (25 mg total) by mouth daily. Give 1 tablet by mouth daily for chronic pain and mood 05/01/20   Fargo, Amy E, NP  tamsulosin (FLOMAX) 0.4 MG CAPS capsule Take 1 capsule (0.4 mg total) by mouth daily. 05/01/20   Fargo, Amy E, NP  tobramycin-dexamethasone (TOBRADEX) ophthalmic ointment Place 1 application into both eyes 2 (two) times daily as needed. 05/01/20   Fargo, Amy E, NP  torsemide (DEMADEX) 20 MG tablet Take 1 tablet (20 mg total) by mouth daily. FOR HTN/CKD 05/01/20   Yvonna Alanis, NP      Allergies  Amoxicillin-pot clavulanate, Duloxetine hcl, Other, Pregabalin, Gluten meal, and Oxycodone    Review of Systems   Review of Systems  Constitutional:  Negative for chills, fatigue and fever.  HENT:  Positive for nosebleeds. Negative for congestion.   Eyes:  Negative for visual disturbance.  Respiratory:  Negative for cough, chest tightness and shortness of breath.   Cardiovascular:  Negative for chest pain.  Gastrointestinal:  Negative for abdominal pain.  Genitourinary:  Negative for flank pain.  Musculoskeletal:  Negative for back pain.  Neurological:  Negative for headaches.  Psychiatric/Behavioral:   Negative for agitation.   All other systems reviewed and are negative.   Physical Exam Updated Vital Signs BP (!) 154/67   Pulse 81   Temp (!) 97.1 F (36.2 C) (Tympanic)   Resp 18   SpO2 99%  Physical Exam Vitals and nursing note reviewed.  Constitutional:      General: He is not in acute distress.    Appearance: He is well-developed. He is not ill-appearing, toxic-appearing or diaphoretic.  HENT:     Head: Normocephalic and atraumatic.     Comments: Dried blood at the tip of the right nare but no active bleeding seen in the posterior oropharynx or in the right nare.  No blood in the left nare.  No large hematoma seen.  Lungs clear and patient otherwise well-appearing.    Nose: No nasal deformity, signs of injury, laceration, nasal tenderness, congestion or rhinorrhea.     Right Nostril: Epistaxis (dried and not actively bleeding on my exam) present. No septal hematoma.     Left Nostril: No septal hematoma.     Mouth/Throat:     Pharynx: No oropharyngeal exudate or posterior oropharyngeal erythema.  Eyes:     Conjunctiva/sclera: Conjunctivae normal.     Pupils: Pupils are equal, round, and reactive to light.  Cardiovascular:     Rate and Rhythm: Normal rate and regular rhythm.     Heart sounds: No murmur heard. Pulmonary:     Effort: Pulmonary effort is normal. No respiratory distress.     Breath sounds: Normal breath sounds. No wheezing, rhonchi or rales.  Chest:     Chest wall: No tenderness.  Abdominal:     Palpations: Abdomen is soft.     Tenderness: There is no abdominal tenderness. There is no guarding or rebound.  Musculoskeletal:        General: No swelling or tenderness.     Cervical back: Neck supple. No tenderness.  Skin:    General: Skin is warm and dry.     Capillary Refill: Capillary refill takes less than 2 seconds.     Findings: No erythema.  Neurological:     General: No focal deficit present.     Mental Status: He is alert.  Psychiatric:         Mood and Affect: Mood normal.     ED Results / Procedures / Treatments   Labs (all labs ordered are listed, but only abnormal results are displayed) Labs Reviewed - No data to display  EKG None  Radiology No results found.  Procedures .Epistaxis Management  Date/Time: 05/24/2022 11:03 PM  Performed by: Courtney Paris, MD Authorized by: Courtney Paris, MD   Consent:    Consent obtained:  Verbal   Consent given by:  Patient   Risks, benefits, and alternatives were discussed: yes     Risks discussed:  Bleeding, nasal injury and pain   Alternatives discussed:  No treatment Universal  protocol:    Patient identity confirmed:  Verbally with patient Anesthesia:    Anesthesia method:  None Procedure details:    Treatment site:  R posterior   Treatment method:  Nasal balloon   Treatment complexity:  Extensive   Treatment episode: initial   Post-procedure details:    Assessment:  Bleeding decreased   Procedure completion:  Tolerated with difficulty     Medications Ordered in ED Medications  tranexamic acid (CYKLOKAPRON) injection 500 mg (has no administration in time range)  oxymetazoline (AFRIN) 0.05 % nasal spray 2 spray (2 sprays Right Nare Given by Other 05/24/22 2105)  traMADol (ULTRAM) tablet 50 mg (50 mg Oral Given 05/24/22 2105)  acetaminophen (TYLENOL) tablet 650 mg (650 mg Oral Given 05/24/22 2138)  HYDROcodone-acetaminophen (NORCO/VICODIN) 5-325 MG per tablet 1 tablet (1 tablet Oral Given 05/24/22 2244)    ED Course/ Medical Decision Making/ A&P                             Medical Decision Making Risk OTC drugs. Prescription drug management.    Gabriel Ayala is a 87 y.o. male with a past medical history significant for hypertension, paroxysmal atrial fibrillation on Pradaxa, CKD, and CHF who presents with right nare epistaxis.  According to patient, for the last 2 or 3 hours he has had bleeding out of his right nare only and down the back  of his throat.  He said he is never had significant nosebleeds before and he reports they are supposed to stop his blood thinners on Tuesday had a PCP appointment.  He denies trauma.  Denies any fevers, chills, congestion, or cough.  Denies any chest pain, shortness of breath, or lightheadedness.  Does not feel like he is going to pass out.  Denies any other complaints.  On exam, lungs clear and chest nontender.  Abdomen nontender.  Patient has bleeding going down the back of his throat and some dried blood at the tip of the right nare.  Do not see active bleeding anteriorly and cannot see an anterior bleed.  Had a shared decision-making conversation with patient about treatment.  Patient would like to try clearing out the clots and Afrin initially.  This was attempted and he continued to bleed.  We then used a TXA soaked rapid Rhino that was 7.5 cm long and placed it.  We did not appear to have the 9.5 cm posterior packing available currently.  After this was done, it was inflated.  Patient had cessation of bleeding when it was inflated further although this caused significant pain.  We gave Tylenol, a home Ultram earlier, and then a Norco with some improvement in pain.  Patient did request we deflated slightly and so we took 2 cc out of the balloon.  This help with his pain but the disc also very mild oozing.  Patient is refusing reinflation due to pain.  Patient would like to go home.  Will send patient home with improvement in bleeding although there is some small mild oozing still and recommend he hold on the Pradaxa until he talks to his doctor as he says they are going to stop it in the next several days.  Patient will get prescription for Norco as he reports he is tolerated that before and it has helped at night and he will follow-up with ENT in the next several days.  Patient agrees with plan of care and has no other  questions or concerns.  Patient will be discharged with P TAR.  He understands  that if symptoms were to recur, change, or worsen, he needs to follow-up [facility] emergency department for ENT consultation.        Final Clinical Impression(s) / ED Diagnoses Final diagnoses:  Right-sided epistaxis    Rx / DC Orders ED Discharge Orders          Ordered    HYDROcodone-acetaminophen (NORCO/VICODIN) 5-325 MG tablet  Every 4 hours PRN        05/24/22 2252           Clinical Impression: 1. Right-sided epistaxis     Disposition: Discharge  Condition: Good  I have discussed the results, Dx and Tx plan with the pt(& family if present). He/she/they expressed understanding and agree(s) with the plan. Discharge instructions discussed at great length. Strict return precautions discussed and pt &/or family have verbalized understanding of the instructions. No further questions at time of discharge.    New Prescriptions   HYDROCODONE-ACETAMINOPHEN (NORCO/VICODIN) 5-325 MG TABLET    Take 1 tablet by mouth every 4 (four) hours as needed.    Follow Up: Melissa Montane, MD [address] [place] [address] [phone]   with ENT  Lowanda Foster, [place] [id number] [place] [address] [phone]         Girolamo Lortie, Gwenyth Allegra, MD 05/24/22 [phone]

## 2022-05-24 NOTE — ED Triage Notes (Signed)
Pt w/ epistaxis x 1 hr; no injury/trauma; takes Eliquis; minimal bleeding on arrival, but pt is spitting up blood frequently

## 2022-05-25 NOTE — ED Notes (Signed)
PTAR on site to take pt back home. Report called to Neoma Laming at [place]. Pt appears comfortable, no longer complaining of pain and states meds given to him for pain were working. A&O x 4.

## 2023-06-23 IMAGING — CR DG HIP (WITH OR WITHOUT PELVIS) 2-3V*L*
4 series · 4 of 4 positions shown · non-contrast
Comparison: CT AP 03/11/2020

CLINICAL DATA: Bilateral hip pain. Status post right total hip
weeks ago.

EXAM:
DG HIP (WITH OR WITHOUT PELVIS) 2-3V LEFT

[t pelvis a.p. (1 of 2)]
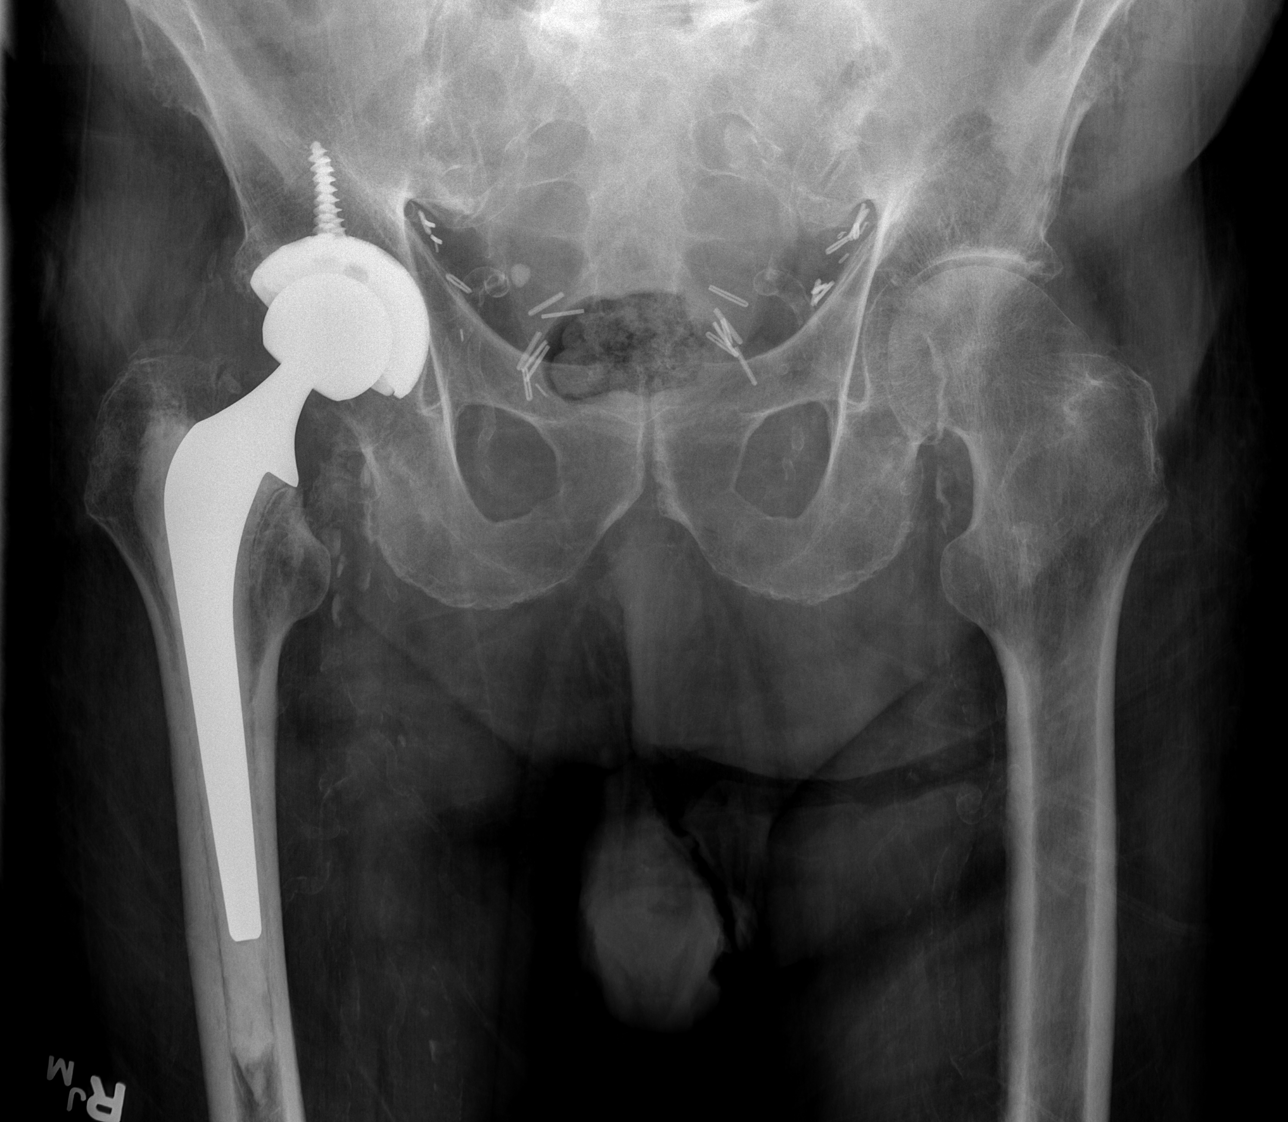

[t pelvis a.p. (2 of 2)]
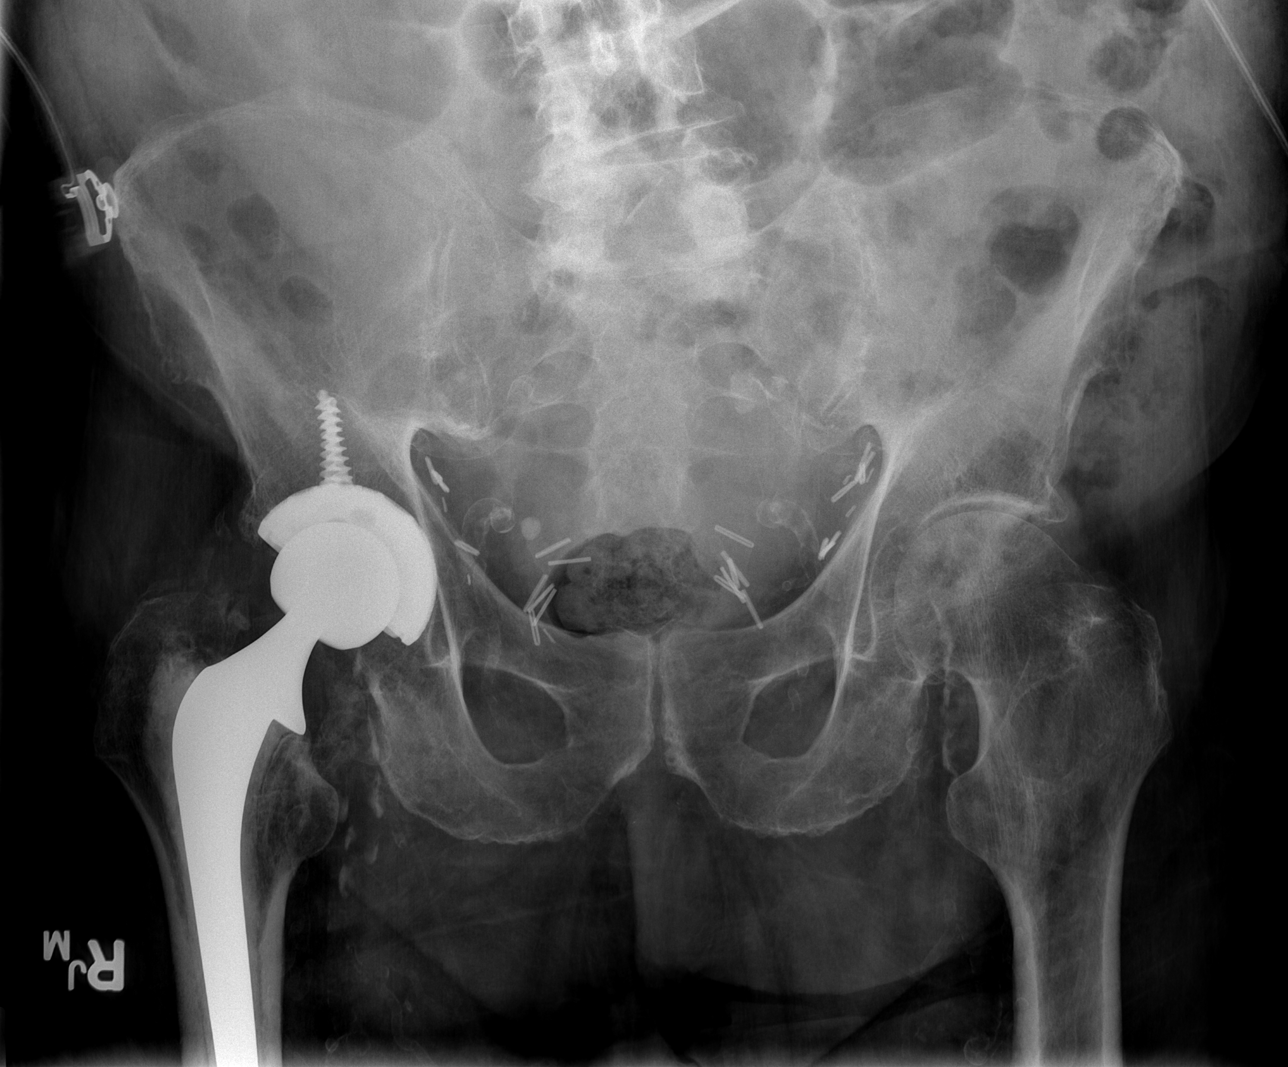

[t hip ap left]
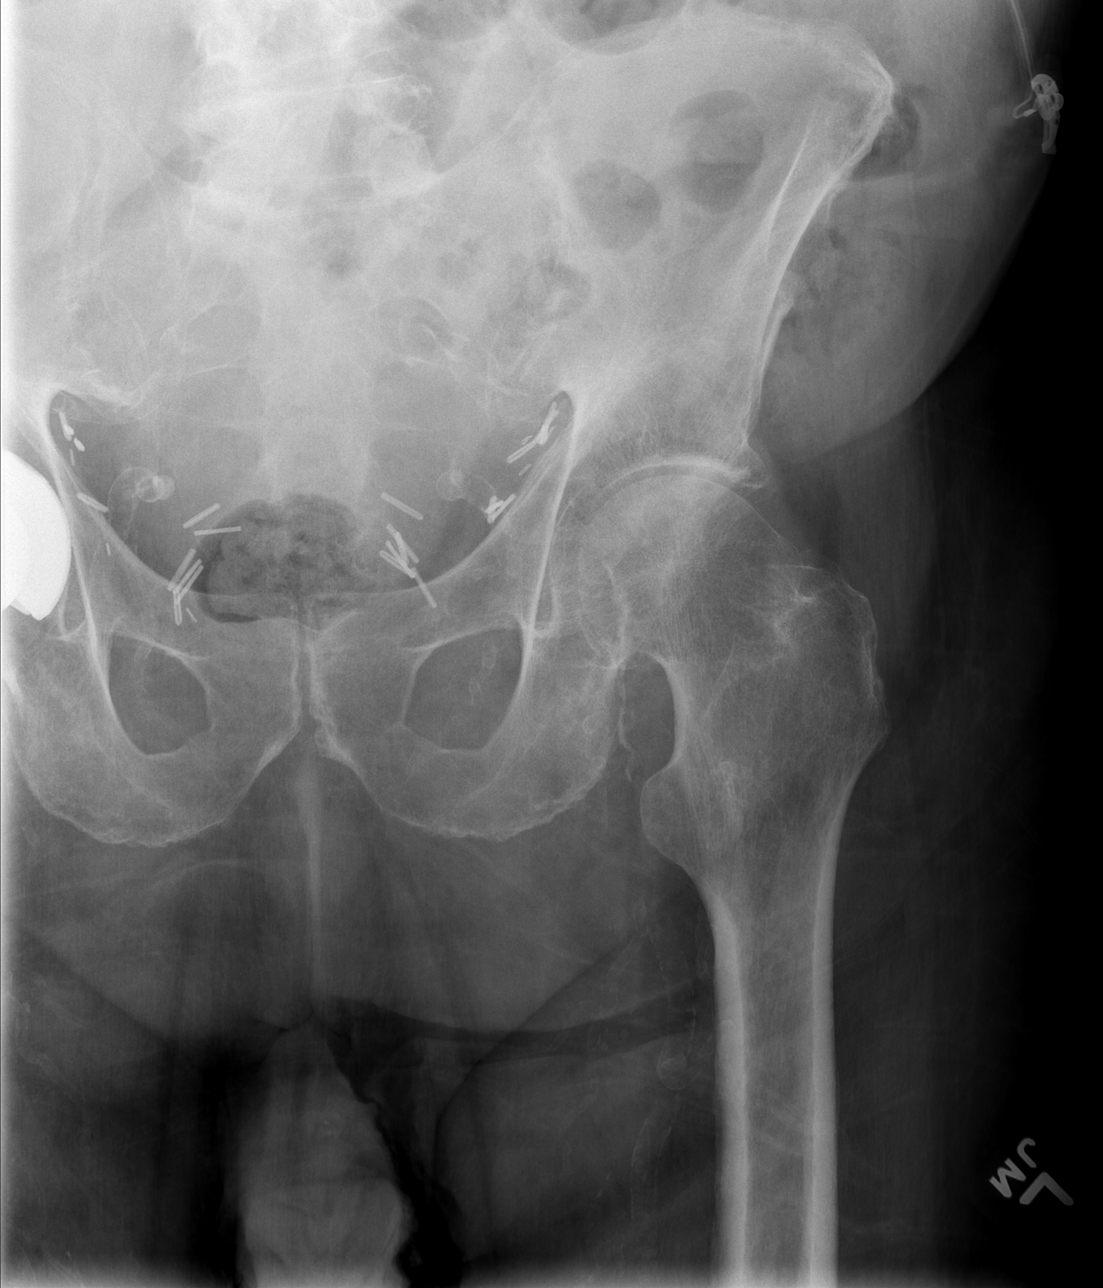

[t hip frog leg left]
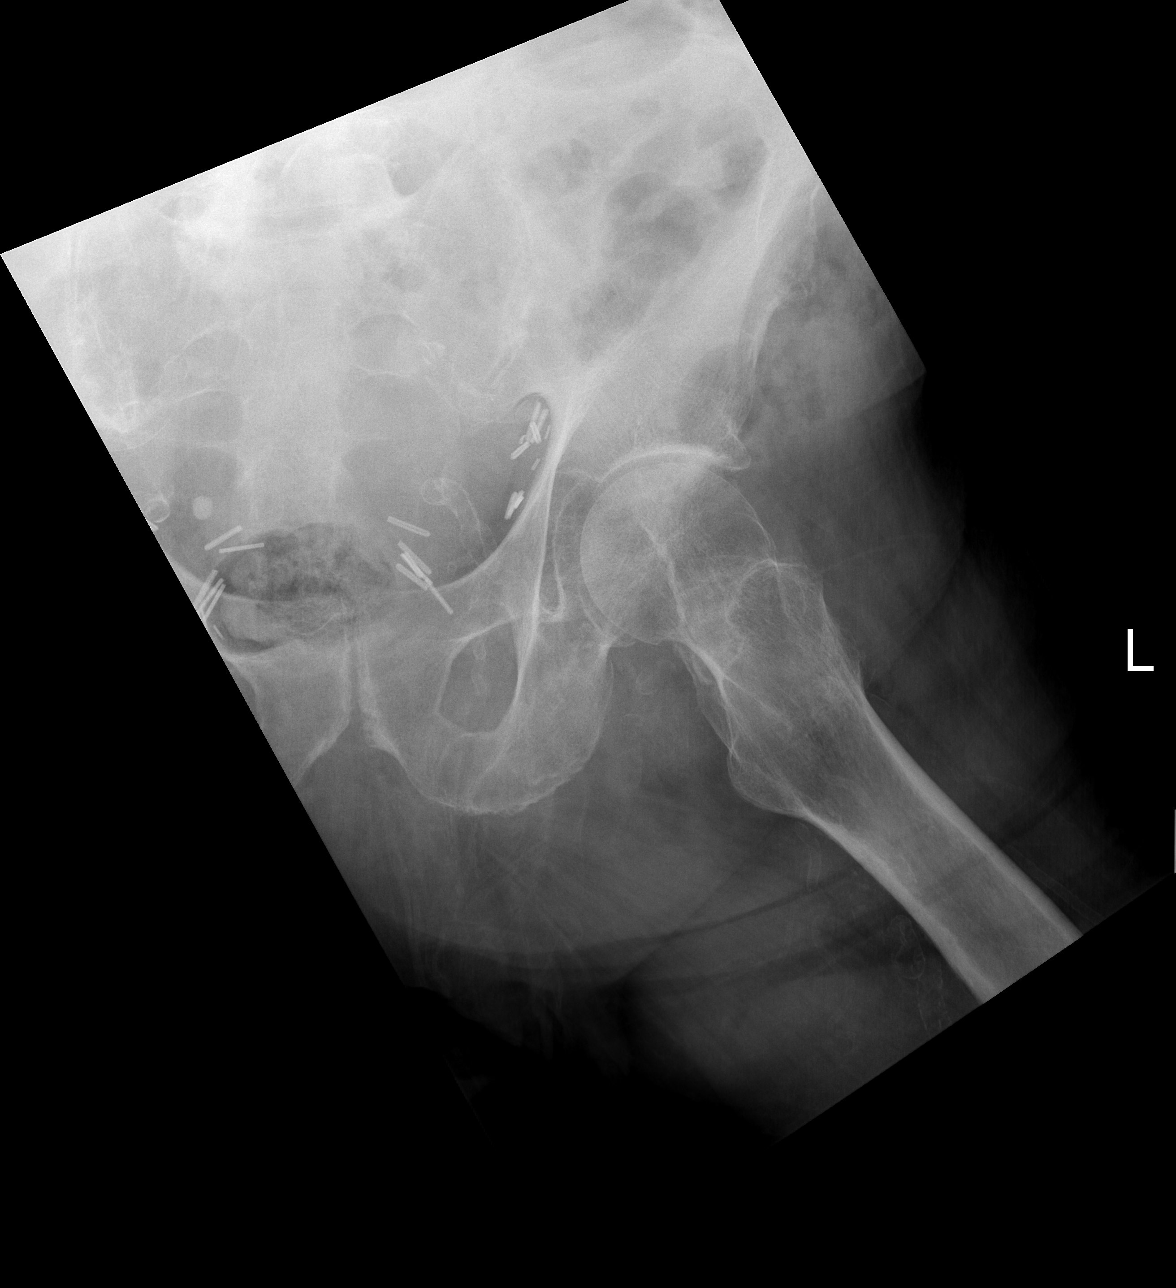

[4 of 4 positions shown; findings below may reference images not displayed]

FINDINGS: Stable postoperative appearance of the right hip status post total
arthroplasty. No sign periprosthetic fracture or subluxation. The
left hip appears located and intact. There is moderate joint space
narrowing with subchondral sclerosis and marginal spur formation.
Surgical clips are identified within both sides of pelvis. Scoliosis
and lumbar degenerative disc disease.
IMPRESSION: 1. Moderate left hip osteoarthritis.
2. Status post right hip arthroplasty

## 2023-12-07 ENCOUNTER — Encounter (HOSPITAL_COMMUNITY): Payer: Self-pay | Admitting: Emergency Medicine

## 2023-12-07 ENCOUNTER — Emergency Department (HOSPITAL_COMMUNITY)

## 2023-12-07 ENCOUNTER — Inpatient Hospital Stay (HOSPITAL_COMMUNITY)
Admission: EM | Admit: 2023-12-07 | Discharge: 2024-01-09 | DRG: 291 | Disposition: E | Attending: Internal Medicine | Admitting: Internal Medicine

## 2023-12-07 ENCOUNTER — Other Ambulatory Visit: Payer: Self-pay

## 2023-12-07 DIAGNOSIS — L89313 Pressure ulcer of right buttock, stage 3: Secondary | ICD-10-CM | POA: Diagnosis present

## 2023-12-07 DIAGNOSIS — E875 Hyperkalemia: Secondary | ICD-10-CM | POA: Diagnosis present

## 2023-12-07 DIAGNOSIS — I130 Hypertensive heart and chronic kidney disease with heart failure and stage 1 through stage 4 chronic kidney disease, or unspecified chronic kidney disease: Principal | ICD-10-CM | POA: Diagnosis present

## 2023-12-07 DIAGNOSIS — K439 Ventral hernia without obstruction or gangrene: Secondary | ICD-10-CM | POA: Diagnosis present

## 2023-12-07 DIAGNOSIS — K5900 Constipation, unspecified: Secondary | ICD-10-CM | POA: Diagnosis present

## 2023-12-07 DIAGNOSIS — I10 Essential (primary) hypertension: Secondary | ICD-10-CM | POA: Diagnosis not present

## 2023-12-07 DIAGNOSIS — M25551 Pain in right hip: Secondary | ICD-10-CM | POA: Diagnosis present

## 2023-12-07 DIAGNOSIS — R54 Age-related physical debility: Secondary | ICD-10-CM | POA: Diagnosis present

## 2023-12-07 DIAGNOSIS — I340 Nonrheumatic mitral (valve) insufficiency: Secondary | ICD-10-CM | POA: Diagnosis present

## 2023-12-07 DIAGNOSIS — I700 Atherosclerosis of aorta: Secondary | ICD-10-CM | POA: Diagnosis present

## 2023-12-07 DIAGNOSIS — I444 Left anterior fascicular block: Secondary | ICD-10-CM | POA: Diagnosis present

## 2023-12-07 DIAGNOSIS — E1122 Type 2 diabetes mellitus with diabetic chronic kidney disease: Secondary | ICD-10-CM | POA: Diagnosis present

## 2023-12-07 DIAGNOSIS — Z6820 Body mass index (BMI) 20.0-20.9, adult: Secondary | ICD-10-CM

## 2023-12-07 DIAGNOSIS — R1310 Dysphagia, unspecified: Secondary | ICD-10-CM | POA: Diagnosis present

## 2023-12-07 DIAGNOSIS — J9601 Acute respiratory failure with hypoxia: Secondary | ICD-10-CM | POA: Diagnosis present

## 2023-12-07 DIAGNOSIS — Z604 Social exclusion and rejection: Secondary | ICD-10-CM | POA: Diagnosis present

## 2023-12-07 DIAGNOSIS — R600 Localized edema: Secondary | ICD-10-CM

## 2023-12-07 DIAGNOSIS — I4819 Other persistent atrial fibrillation: Secondary | ICD-10-CM | POA: Diagnosis present

## 2023-12-07 DIAGNOSIS — E871 Hypo-osmolality and hyponatremia: Secondary | ICD-10-CM | POA: Diagnosis present

## 2023-12-07 DIAGNOSIS — I480 Paroxysmal atrial fibrillation: Secondary | ICD-10-CM | POA: Diagnosis present

## 2023-12-07 DIAGNOSIS — Z885 Allergy status to narcotic agent status: Secondary | ICD-10-CM

## 2023-12-07 DIAGNOSIS — J9602 Acute respiratory failure with hypercapnia: Secondary | ICD-10-CM | POA: Diagnosis present

## 2023-12-07 DIAGNOSIS — R531 Weakness: Secondary | ICD-10-CM | POA: Diagnosis present

## 2023-12-07 DIAGNOSIS — T148XXA Other injury of unspecified body region, initial encounter: Secondary | ICD-10-CM | POA: Diagnosis not present

## 2023-12-07 DIAGNOSIS — Z888 Allergy status to other drugs, medicaments and biological substances status: Secondary | ICD-10-CM

## 2023-12-07 DIAGNOSIS — G9349 Other encephalopathy: Secondary | ICD-10-CM | POA: Diagnosis present

## 2023-12-07 DIAGNOSIS — R338 Other retention of urine: Secondary | ICD-10-CM | POA: Diagnosis not present

## 2023-12-07 DIAGNOSIS — L89153 Pressure ulcer of sacral region, stage 3: Secondary | ICD-10-CM | POA: Diagnosis present

## 2023-12-07 DIAGNOSIS — N179 Acute kidney failure, unspecified: Secondary | ICD-10-CM | POA: Diagnosis present

## 2023-12-07 DIAGNOSIS — E11649 Type 2 diabetes mellitus with hypoglycemia without coma: Secondary | ICD-10-CM | POA: Diagnosis not present

## 2023-12-07 DIAGNOSIS — J9811 Atelectasis: Secondary | ICD-10-CM | POA: Diagnosis present

## 2023-12-07 DIAGNOSIS — Z91018 Allergy to other foods: Secondary | ICD-10-CM

## 2023-12-07 DIAGNOSIS — Z95818 Presence of other cardiac implants and grafts: Secondary | ICD-10-CM

## 2023-12-07 DIAGNOSIS — K6289 Other specified diseases of anus and rectum: Principal | ICD-10-CM | POA: Diagnosis present

## 2023-12-07 DIAGNOSIS — R64 Cachexia: Secondary | ICD-10-CM | POA: Diagnosis present

## 2023-12-07 DIAGNOSIS — L89323 Pressure ulcer of left buttock, stage 3: Secondary | ICD-10-CM | POA: Diagnosis present

## 2023-12-07 DIAGNOSIS — R339 Retention of urine, unspecified: Secondary | ICD-10-CM | POA: Diagnosis present

## 2023-12-07 DIAGNOSIS — I5033 Acute on chronic diastolic (congestive) heart failure: Secondary | ICD-10-CM | POA: Diagnosis present

## 2023-12-07 DIAGNOSIS — G9341 Metabolic encephalopathy: Secondary | ICD-10-CM | POA: Diagnosis present

## 2023-12-07 DIAGNOSIS — Z66 Do not resuscitate: Secondary | ICD-10-CM | POA: Diagnosis present

## 2023-12-07 DIAGNOSIS — Z79899 Other long term (current) drug therapy: Secondary | ICD-10-CM

## 2023-12-07 DIAGNOSIS — R000 Tachycardia, unspecified: Secondary | ICD-10-CM | POA: Diagnosis not present

## 2023-12-07 DIAGNOSIS — Z7901 Long term (current) use of anticoagulants: Secondary | ICD-10-CM

## 2023-12-07 DIAGNOSIS — Z880 Allergy status to penicillin: Secondary | ICD-10-CM

## 2023-12-07 DIAGNOSIS — Z515 Encounter for palliative care: Secondary | ICD-10-CM | POA: Diagnosis not present

## 2023-12-07 DIAGNOSIS — R7989 Other specified abnormal findings of blood chemistry: Secondary | ICD-10-CM

## 2023-12-07 DIAGNOSIS — N1831 Chronic kidney disease, stage 3a: Secondary | ICD-10-CM | POA: Diagnosis present

## 2023-12-07 DIAGNOSIS — J90 Pleural effusion, not elsewhere classified: Secondary | ICD-10-CM | POA: Diagnosis present

## 2023-12-07 DIAGNOSIS — G8929 Other chronic pain: Secondary | ICD-10-CM | POA: Diagnosis present

## 2023-12-07 DIAGNOSIS — M4800 Spinal stenosis, site unspecified: Secondary | ICD-10-CM | POA: Diagnosis present

## 2023-12-07 DIAGNOSIS — K5730 Diverticulosis of large intestine without perforation or abscess without bleeding: Secondary | ICD-10-CM | POA: Diagnosis present

## 2023-12-07 DIAGNOSIS — L309 Dermatitis, unspecified: Secondary | ICD-10-CM | POA: Diagnosis present

## 2023-12-07 DIAGNOSIS — I83008 Varicose veins of unspecified lower extremity with ulcer other part of lower leg: Secondary | ICD-10-CM

## 2023-12-07 DIAGNOSIS — Z9049 Acquired absence of other specified parts of digestive tract: Secondary | ICD-10-CM

## 2023-12-07 DIAGNOSIS — D631 Anemia in chronic kidney disease: Secondary | ICD-10-CM | POA: Diagnosis present

## 2023-12-07 DIAGNOSIS — L97809 Non-pressure chronic ulcer of other part of unspecified lower leg with unspecified severity: Secondary | ICD-10-CM

## 2023-12-07 DIAGNOSIS — R5381 Other malaise: Secondary | ICD-10-CM | POA: Diagnosis present

## 2023-12-07 DIAGNOSIS — I872 Venous insufficiency (chronic) (peripheral): Secondary | ICD-10-CM | POA: Diagnosis present

## 2023-12-07 DIAGNOSIS — M549 Dorsalgia, unspecified: Secondary | ICD-10-CM

## 2023-12-07 LAB — URINALYSIS, ROUTINE W REFLEX MICROSCOPIC
Bilirubin Urine: NEGATIVE
Glucose, UA: NEGATIVE mg/dL
Hgb urine dipstick: NEGATIVE
Ketones, ur: NEGATIVE mg/dL
Nitrite: NEGATIVE
Protein, ur: 30 mg/dL — AB
Specific Gravity, Urine: 1.03 (ref 1.005–1.030)
WBC, UA: >50 WBC/hpf (ref 0–5)
pH: 5 (ref 5.0–8.0)

## 2023-12-07 LAB — COMPREHENSIVE METABOLIC PANEL WITH GFR
ALT: 8 U/L (ref 0–44)
AST: 12 U/L — ABNORMAL LOW (ref 15–41)
Albumin: 3.9 g/dL (ref 3.5–5.0)
Alkaline Phosphatase: 113 U/L (ref 38–126)
Anion gap: 10 (ref 5–15)
BUN: 17 mg/dL (ref 8–23)
CO2: 22 mmol/L (ref 22–32)
Calcium: 9.4 mg/dL (ref 8.9–10.3)
Chloride: 97 mmol/L — ABNORMAL LOW (ref 98–111)
Creatinine, Ser: 0.92 mg/dL (ref 0.61–1.24)
GFR, Estimated: >60 mL/min (ref >60)
Glucose, Bld: 81 mg/dL (ref 70–99)
Potassium: 4.5 mmol/L (ref 3.5–5.1)
Sodium: 129 mmol/L — ABNORMAL LOW (ref 135–145)
Total Bilirubin: 0.6 mg/dL (ref 0.0–1.2)
Total Protein: 6.2 g/dL — ABNORMAL LOW (ref 6.5–8.1)

## 2023-12-07 LAB — CBC WITH DIFFERENTIAL/PLATELET
Abs Immature Granulocytes: 0.02 K/uL (ref 0.00–0.07)
Basophils Absolute: 0.1 K/uL (ref 0.0–0.1)
Basophils Relative: 2 %
Eosinophils Absolute: 0.1 K/uL (ref 0.0–0.5)
Eosinophils Relative: 2 %
HCT: 33.5 % — ABNORMAL LOW (ref 39.0–52.0)
Hemoglobin: 10.3 g/dL — ABNORMAL LOW (ref 13.0–17.0)
Immature Granulocytes: 0 %
Lymphocytes Relative: 7 %
Lymphs Abs: 0.5 K/uL — ABNORMAL LOW (ref 0.7–4.0)
MCH: 28.2 pg (ref 26.0–34.0)
MCHC: 30.7 g/dL (ref 30.0–36.0)
MCV: 91.8 fL (ref 80.0–100.0)
Monocytes Absolute: 0.9 K/uL (ref 0.1–1.0)
Monocytes Relative: 14 %
Neutro Abs: 4.6 K/uL (ref 1.7–7.7)
Neutrophils Relative %: 75 %
Platelets: 299 K/uL (ref 150–400)
RBC: 3.65 MIL/uL — ABNORMAL LOW (ref 4.22–5.81)
RDW: 14.3 % (ref 11.5–15.5)
WBC: 6.2 K/uL (ref 4.0–10.5)
nRBC: 0 % (ref 0.0–0.2)

## 2023-12-07 LAB — TROPONIN T, HIGH SENSITIVITY
Troponin T High Sensitivity: 53 ng/L — ABNORMAL HIGH (ref 0–19)
Troponin T High Sensitivity: 42 ng/L — ABNORMAL HIGH (ref 0–19)

## 2023-12-07 LAB — PRO BRAIN NATRIURETIC PEPTIDE: Pro Brain Natriuretic Peptide: 10592 pg/mL — ABNORMAL HIGH (ref <300.0)

## 2023-12-07 LAB — LIPASE, BLOOD: Lipase: 11 U/L (ref 11–51)

## 2023-12-07 MED ORDER — POLYETHYLENE GLYCOL 3350 17 G PO PACK: 17.0000 g | PACK | Freq: Every day | ORAL | Status: DC | PRN

## 2023-12-07 MED ORDER — FENTANYL CITRATE PF 50 MCG/ML IJ SOSY
12.5000 ug | PREFILLED_SYRINGE | INTRAMUSCULAR | Status: DC | PRN
  Administered 2023-12-07 – 2023-12-08 (×4): 50 ug via INTRAVENOUS
  Administered 2023-12-09 (×2): 12.5 ug via INTRAVENOUS
  Administered 2023-12-09 (×2): 25 ug via INTRAVENOUS
  Administered 2023-12-09: 12.5 ug via INTRAVENOUS
  Administered 2023-12-10 (×2): 50 ug via INTRAVENOUS
  Administered 2023-12-10: 25 ug via INTRAVENOUS
  Filled 2023-12-07 (×14): qty 1

## 2023-12-07 MED ORDER — PANTOPRAZOLE SODIUM 40 MG PO TBEC
40.0000 mg | DELAYED_RELEASE_TABLET | Freq: Every day | ORAL | Status: DC
Start: 2023-12-08 — End: 2023-12-08

## 2023-12-07 MED ORDER — BISACODYL 5 MG PO TBEC: 5.0000 mg | DELAYED_RELEASE_TABLET | Freq: Every day | ORAL | Status: DC | PRN

## 2023-12-07 MED ORDER — ACETAMINOPHEN 650 MG RE SUPP: 650.0000 mg | Freq: Four times a day (QID) | RECTAL | Status: DC | PRN

## 2023-12-07 MED ORDER — IOHEXOL 300 MG/ML  SOLN
100.0000 mL | Freq: Once | INTRAMUSCULAR | Status: AC | PRN
  Administered 2023-12-07: 100 mL via INTRAVENOUS

## 2023-12-07 MED ORDER — ONDANSETRON HCL 4 MG/2ML IJ SOLN
4.0000 mg | Freq: Once | INTRAMUSCULAR | Status: AC
  Administered 2023-12-07: 4 mg via INTRAVENOUS
  Filled 2023-12-07: qty 2

## 2023-12-07 MED ORDER — MORPHINE SULFATE (PF) 4 MG/ML IV SOLN
4.0000 mg | Freq: Once | INTRAVENOUS | Status: AC
  Administered 2023-12-07: 4 mg via INTRAVENOUS
  Filled 2023-12-07: qty 1

## 2023-12-07 MED ORDER — CHLORHEXIDINE GLUCONATE CLOTH 2 % EX PADS
6.0000 | MEDICATED_PAD | Freq: Every day | CUTANEOUS | Status: DC
Start: 2023-12-07 — End: 2023-12-11
  Administered 2023-12-07 – 2023-12-10 (×3): 6 via TOPICAL

## 2023-12-07 MED ORDER — OXYCODONE HCL 5 MG PO TABS
5.0000 mg | ORAL_TABLET | ORAL | Status: DC | PRN
  Administered 2023-12-07 – 2023-12-09 (×6): 5 mg via ORAL
  Filled 2023-12-07 (×6): qty 1

## 2023-12-07 MED ORDER — FUROSEMIDE 10 MG/ML IJ SOLN
40.0000 mg | Freq: Two times a day (BID) | INTRAMUSCULAR | Status: DC
  Administered 2023-12-08 – 2023-12-09 (×3): 40 mg via INTRAVENOUS
  Filled 2023-12-07 (×3): qty 4

## 2023-12-07 MED ORDER — PROCHLORPERAZINE EDISYLATE 10 MG/2ML IJ SOLN
5.0000 mg | Freq: Four times a day (QID) | INTRAMUSCULAR | Status: DC | PRN
  Administered 2023-12-08: 5 mg via INTRAVENOUS
  Filled 2023-12-07: qty 2

## 2023-12-07 MED ORDER — FENTANYL CITRATE PF 50 MCG/ML IJ SOSY
50.0000 ug | PREFILLED_SYRINGE | Freq: Once | INTRAMUSCULAR | Status: AC
  Administered 2023-12-07: 50 ug via INTRAVENOUS
  Filled 2023-12-07: qty 1

## 2023-12-07 MED ORDER — ACETAMINOPHEN 325 MG PO TABS
650.0000 mg | ORAL_TABLET | Freq: Four times a day (QID) | ORAL | Status: DC | PRN
  Administered 2023-12-09: 650 mg via ORAL
  Filled 2023-12-07: qty 2

## 2023-12-07 MED ORDER — ENOXAPARIN SODIUM 40 MG/0.4ML IJ SOSY
40.0000 mg | PREFILLED_SYRINGE | Freq: Every day | INTRAMUSCULAR | Status: DC
  Administered 2023-12-07 – 2023-12-08 (×2): 40 mg via SUBCUTANEOUS
  Filled 2023-12-07 (×2): qty 0.4

## 2023-12-07 MED ORDER — FUROSEMIDE 10 MG/ML IJ SOLN
20.0000 mg | Freq: Once | INTRAMUSCULAR | Status: AC
  Administered 2023-12-07: 20 mg via INTRAVENOUS
  Filled 2023-12-07: qty 4

## 2023-12-07 MED ORDER — GABAPENTIN 100 MG PO CAPS
100.0000 mg | ORAL_CAPSULE | Freq: Three times a day (TID) | ORAL | Status: DC
  Administered 2023-12-07 – 2023-12-09 (×6): 100 mg via ORAL
  Filled 2023-12-07 (×6): qty 1

## 2023-12-07 MED ORDER — SODIUM CHLORIDE 0.9% FLUSH
3.0000 mL | Freq: Two times a day (BID) | INTRAVENOUS | Status: DC
  Administered 2023-12-07 – 2023-12-11 (×8): 3 mL via INTRAVENOUS

## 2023-12-07 MED ORDER — SERTRALINE HCL 50 MG PO TABS: 25.0000 mg | ORAL_TABLET | Freq: Every day | ORAL | Status: DC

## 2023-12-07 NOTE — H&P (Addendum)
 History and Physical    Gabriel Ayala FMW:[id number] DOB: [birth year] DOA: 12/07/2023  PCP: Gabriel Deloras Cea, MD   Patient coming from: Home   Chief Complaint: General weakness, difficulty voiding, constipation, generalized aches, nausea    HPI: Gabriel Ayala is a 88 y.o. male with medical history significant for neuropathy, chronic HFpEF, atrial fibrillation status post Watchman procedure, and severe mitral regurgitation status post MitraClip who presents with multiple complaints including generalized weakness, aches and pains, nausea, constipation, and difficulty voiding over the past 3 days or so.  Patient reports that he has been too generally weak and in too much pain to care for himself the last couple days and has also been experiencing severe nausea without vomiting and difficulty voiding.  He denies any abdominal or rectal pain, denies diarrhea, and denies fever, chills, or chest pain.  He denies recent fall or trauma.  ED Course: Upon arrival to the ED, patient is found to be afebrile and saturating low 90s on room air with mild tachypnea and stable BP.  Labs are most notable for sodium 129, normal creatinine, normal WBC, hemoglobin 10.3, proBNP 10,592, and troponin 53.  CT demonstrates rectal wall thickening with surrounding and amatory changes, moderate right pleural effusion, and small left pleural effusion.  Patient was treated in the ED with IV Lasix , fentanyl, morphine , and Zofran .  Foley catheter was placed in the ED.  Review of Systems:  All other systems reviewed and apart from HPI, are negative.  Past Medical History:  Diagnosis Date   A-fib (HCC)    CHF (congestive heart failure) (HCC)    Neuropathy     Past Surgical History:  Procedure Laterality Date   APPENDECTOMY     BACK SURGERY     EYE SURGERY     detached retina   NASAL RECONSTRUCTION     PROSTATE SURGERY      Social History:   reports that he has never smoked. He has never used smokeless  tobacco. He reports current alcohol use of about 2.0 standard drinks of alcohol per week. He reports that he does not use drugs.  Allergies  Allergen Reactions   Amoxicillin-Pot Clavulanate Other (See Comments)   Duloxetine Hcl Other (See Comments)   Other Other (See Comments)   Pregabalin Other (See Comments)   Gluten Meal    Oxycodone Other (See Comments)    History reviewed. No pertinent family history.   Prior to Admission medications   Medication Sig Start Date End Date Taking? Authorizing Provider  acetaminophen  (TYLENOL ) 500 MG tablet Take 1,000 mg by mouth 2 (two) times daily.    [provider]  alum & mag hydroxide-simeth (MAALOX PLUS) 400-400-40 MG/5ML suspension Take 30 mLs by mouth every 4 (four) hours as needed for indigestion.    [provider]  dabigatran  (PRADAXA ) 75 MG CAPS capsule Take 1 capsule (75 mg total) by mouth 2 (two) times daily. FOR AFIB 05/01/20   Fargo, Amy E, NP  docusate sodium (COLACE) 100 MG capsule Take 100 mg by mouth 2 (two) times daily.    [provider]  dupilumab  (DUPIXENT ) 200 MG/1. prefilled syringe Inject 300 mg into the skin every 14 (fourteen) days. FOR ECZEMA 05/01/20   Fargo, Amy E, NP  gabapentin  (NEURONTIN ) 300 MG capsule Take 1 capsule (300 mg total) by mouth 2 (two) times daily. FOR PERIPHERAL NEUROPATHY 05/01/20   Gil No E, NP  HYDROcodone -acetaminophen  (NORCO/VICODIN) 5-325 MG tablet Take 1 tablet by mouth every 4 (  four) hours as needed. 05/24/22   Tegeler, Lonni PARAS, MD  methocarbamol  (ROBAXIN ) 500 MG tablet Take 1 tablet (500 mg total) by mouth 3 (three) times daily. For muscle tightness 01/01/21   Sofia, Leslie K, PA-C  mirtazapine  (REMERON ) 30 MG tablet Take 1 tablet (30 mg total) by mouth at bedtime. 05/01/20   Fargo, Amy E, NP  Multiple Vitamins-Minerals (ICAPS AREDS 2 PO) Take by mouth. Give 1 capsule by mouth every evening    [provider]  Nutritional Supplements (ENSURE PO) Take 1  Container by mouth in the morning and at bedtime. (prefers Vanilla) d/t decreased intake. 03/05/20   [provider]  omeprazole  (PRILOSEC) 20 MG capsule Take 1 capsule (20 mg total) by mouth daily. 20 MG CAPSULE: Give 1 capsule by mouth daily 05/01/20   Fargo, Amy E, NP  polyethylene glycol (MIRALAX / GLYCOLAX) 17 g packet Take 17 g by mouth daily as needed.    [provider]  Saccharomyces boulardii (PROBIOTIC) 250 MG CAPS Take 1 capsule by mouth daily. For [facility] [facility] 05/01/20   Gil Greig BRAVO, NP  sertraline  (ZOLOFT ) 25 MG tablet Take 1 tablet (25 mg total) by mouth daily. Give 1 tablet by mouth daily for chronic pain and mood 05/01/20   Fargo, Amy E, NP  tamsulosin  (FLOMAX ) 0.4 MG CAPS capsule Take 1 capsule (0.4 mg total) by mouth daily. 05/01/20   Fargo, Amy E, NP  tobramycin -dexamethasone  (TOBRADEX ) ophthalmic ointment Place 1 application into both eyes 2 (two) times daily as needed. 05/01/20   Fargo, Amy E, NP  torsemide  (DEMADEX ) 20 MG tablet Take 1 tablet (20 mg total) by mouth daily. FOR HTN/CKD 05/01/20   Gil Greig BRAVO, NP    Physical Exam: Vitals:   12/07/23 1400 12/07/23 1415 12/07/23 1645 12/07/23 1715  BP: (!) 151/65 134/62 (!) 152/78 139/61  Pulse: (!) 59 61 69 (!) 57  Resp: (!) 23 19 18  (!) 22  Temp:      TempSrc:      SpO2: 99% 93% 94% 98%     Constitutional: NAD, no pallor or diaphoresis  Eyes: PERTLA, lids and conjunctivae normal ENMT: Mucous membranes are moist. Posterior pharynx clear of any exudate or lesions.   Neck: supple, no masses  Respiratory: no wheezing, no crackles. No accessory muscle use.  Cardiovascular: S1 & S2 heard, regular rate and rhythm. Bilateral lower extremity edema. JVD. Abdomen: No tenderness, soft. Bowel sounds active.  Musculoskeletal: no clubbing / cyanosis. No joint deformity upper and lower extremities.   Skin: Hyperpigmentation and crusted lesions involving distal LEs bilaterally. Warm, dry, well-perfused. Neurologic: CN  2-12 grossly intact aside from hearing deficit. Generally weak and listless. Awake and oriented.  Psychiatric: Calm. Cooperative.    Labs and Imaging on Admission: I have personally reviewed following labs and imaging studies  CBC: Recent Labs  Lab 12/07/23 1320  WBC 6.2  NEUTROABS 4.6  HGB 10.3*  HCT 33.5*  MCV 91.8  PLT 299   Basic Metabolic Panel: Recent Labs  Lab 12/07/23 1320  NA 129*  K 4.5  CL 97*  CO2 22  GLUCOSE 81  BUN 17  CREATININE 0.92  CALCIUM 9.4   GFR: CrCl cannot be calculated (Unknown ideal weight.). Liver Function Tests: Recent Labs  Lab 12/07/23 1320  AST 12*  ALT 8  ALKPHOS 113  BILITOT 0.6  PROT 6.2*  ALBUMIN 3.9   Recent Labs  Lab 12/07/23 1320  LIPASE 11   No results for input(s):  AMMONIA in the last 168 hours. Coagulation Profile: No results for input(s): INR, PROTIME in the last 168 hours. Cardiac Enzymes: No results for input(s): CKTOTAL, CKMB, CKMBINDEX, TROPONINI in the last 168 hours. BNP (last 3 results) Recent Labs    12/07/23 1320  PROBNP 10,592.0*   HbA1C: No results for input(s): HGBA1C in the last 72 hours. CBG: No results for input(s): GLUCAP in the last 168 hours. Lipid Profile: No results for input(s): CHOL, HDL, LDLCALC, TRIG, CHOLHDL, LDLDIRECT in the last 72 hours. Thyroid Function Tests: No results for input(s): TSH, T4TOTAL, FREET4, T3FREE, THYROIDAB in the last 72 hours. Anemia Panel: No results for input(s): VITAMINB12, FOLATE, FERRITIN, TIBC, IRON, RETICCTPCT in the last 72 hours. Urine analysis: No results found for: COLORURINE, APPEARANCEUR, LABSPEC, PHURINE, GLUCOSEU, HGBUR, BILIRUBINUR, KETONESUR, PROTEINUR, UROBILINOGEN, NITRITE, LEUKOCYTESUR Sepsis Labs: @LABRCNTIP (procalcitonin:4,lacticidven:4) )No results found for this or any previous visit (from the past 240 hours).   Radiological Exams on Admission: CT ABDOMEN  PELVIS W CONTRAST Result Date: 12/07/2023 CLINICAL DATA:  Acute abdominal pain EXAM: CT ABDOMEN AND PELVIS WITH CONTRAST TECHNIQUE: Multidetector CT imaging of the abdomen and pelvis was performed using the standard protocol following bolus administration of intravenous contrast. RADIATION DOSE REDUCTION: This exam was performed according to the departmental dose-optimization program which includes automated exposure control, adjustment of the mA and/or kV according to patient size and/or use of iterative reconstruction technique. CONTRAST:  100mL OMNIPAQUE IOHEXOL 300 MG/ML  SOLN COMPARISON:  CT abdomen and pelvis 03/11/2020. FINDINGS: Lower chest: Small left and moderate right pleural effusions are present with bibasilar atelectasis. The heart is enlarged. Hepatobiliary: No focal liver abnormality is seen. No gallstones, gallbladder wall thickening, or biliary dilatation. Pancreas: Unremarkable. No pancreatic ductal dilatation or surrounding inflammatory changes. Spleen: Normal in size without focal abnormality. Adrenals/Urinary Tract: The bilateral adrenal glands are within normal limits. There scattered rounded hypodensities in both kidneys which are too small to characterize there is a 2.3 cm cyst in the right kidney. There are additional mildly hyperdense rounded areas which are too small to characterize which may represent complex cysts. There is mild bilateral renal atrophy. There is no hydronephrosis. The bladder is within normal limits. Stomach/Bowel: There is rectal wall thickening with surrounding inflammation. There is no evidence for pneumatosis, free air or bowel obstruction. Scattered colonic diverticula are present. The appendix is not definitely visualized. Stomach is within normal limits. Vascular/Lymphatic: Aortic atherosclerosis. No enlarged abdominal or pelvic lymph nodes. Reproductive: Prostate is unremarkable. Other: There is bulging of the anterior abdominal wall. There is a anterior left  lower quadrant ventral hernia containing nondilated colon which appears similar to prior study. There is no free fluid or free air. Musculoskeletal: Degenerative changes affect the spine. There is chronic compression deformity of T12. Right hip arthroplasty is present. IMPRESSION: 1. Rectal wall thickening with surrounding inflammation compatible with proctitis. 2. Colonic diverticulosis. 3. Bilateral pleural effusions, right greater than left. 4. Cardiomegaly. 5. Stable left lower quadrant ventral hernia containing nondilated colon. 6. Aortic atherosclerosis. Aortic Atherosclerosis (ICD10-I70.0). Electronically Signed   By: Greig Pique M.D.   On: 12/07/2023 15:11   DG CHEST PORT 1 VIEW Result Date: 12/07/2023 CLINICAL DATA:  Body aches.  Nausea. EXAM: PORTABLE CHEST 1 VIEW COMPARISON:  08/01/2022. FINDINGS: The heart size and mediastinal contours are unchanged. Left atrial appendage occlusion device is again noted. Aortic atherosclerosis. Small to moderate left-greater-than-right pleural effusions with bibasilar opacities favored to reflect atelectasis, not significantly changed compared to the prior exam. No pneumothorax.  No acute osseous abnormality. IMPRESSION: Small to moderate left-greater-than-right pleural effusions with bibasilar opacities favored to reflect atelectasis, not significantly changed compared to the prior exam. Electronically Signed   By: Harrietta Sherry M.D.   On: 12/07/2023 13:40    EKG: Independently reviewed. Atrial fibrillation, PVC, LAFB.   Assessment/Plan   1. Acute on chronic HFpEF; pleural effusions  - Continue diuresis with IV Lasix , monitor weight and I/Os, monitor renal function and electrolytes, update echocardiogram, consider diagnostic thoracentesis if effusions fail to respond to diuresis    2. Hyponatremia  - Serum sodium is 129 in setting of hypervolemia  - Monitor closely while diuresing   3. General weakness, debility  - No focal deficits  - Consult PT    4. PAF  - S/p Watchman procedure in August 2023  5. Chronic leg wounds  - Wound care    6. Proctitis  - No diarrhea, no infectious signs/symptoms, seems to be asymptomatic   - Outpatient follow-up recommended   7. Acute urinary retention  - Foley catheter placed in ED, will continue for now    DVT prophylaxis: Lovenox  Code Status: DNR/DNI, confirmed with patient on admission  Level of Care: Level of care: Telemetry Family Communication: None present  Disposition Plan:  Patient is from: Home  Anticipated d/c is to: TBD Anticipated d/c date is: 12/09/23  Patient currently: Pending improved volume-status, improved electrolytes, PT evaluation, disposition planning Consults called: None  Admission status: Inpatient     Evalene GORMAN Sprinkles, MD [facility]  12/07/2023, 6:11 PM

## 2023-12-07 NOTE — ED Notes (Signed)
 Patient is currently on 2L O2 via  his O2 is reading lower 80s RN readjusted O2 monitor and O2 increased to mid 90s. He does not display any respiratory distress, SOB or difficulty breathing at this time.

## 2023-12-07 NOTE — ED Notes (Signed)
Not in room, pt in CT

## 2023-12-07 NOTE — ED Triage Notes (Signed)
 Patient BIB EMS from home c/o generalized body aches x 3 days. Patient report nausea and vomiting. 1L LR and 4mg  zofran  given by EMS. Patient denies Chest pain and SOB.   BP  151/72 HR 53 RR 20 O2sat 98% on RA CBG 102 Temp 98.1

## 2023-12-07 NOTE — ED Provider Notes (Signed)
 [place] EMERGENCY DEPARTMENT AT [facility] Provider Note   CSN: [id number] Arrival date & time: 12/07/23  1230     Patient presents with: No chief complaint on file.   Gabriel Ayala is a 88 y.o. male who presents via EMS for nausea and severe pain.  Patient has a past medical history of spinal stenosis and claudication, CHF, A-fib status post Watchman procedure, eczema on Dupixent .  He lives at home with a roommate who apparently has morbid obesity and is unable to help much.  He reports that he has been doing well taking care of himself up until 3 to 4 days ago when his back pain became severe.  He has been unable to get up or ambulate around the house.  He is unable to to eat or drink.  He complains of severe nausea but has not been vomiting.  EMS at bedside reports that they were concerned for potential sepsis due to tachypnea patient and overwhelming smell of urine in the home.  Patient reports that his pain is usually very bad but he has severe uncontrolled pain at this time.  HPI     Prior to Admission medications   Medication Sig Start Date End Date Taking? Authorizing Provider  acetaminophen  (TYLENOL ) 500 MG tablet Take 1,000 mg by mouth 2 (two) times daily.    [provider]  alum & mag hydroxide-simeth (MAALOX PLUS) 400-400-40 MG/5ML suspension Take 30 mLs by mouth every 4 (four) hours as needed for indigestion.    [provider]  dabigatran  (PRADAXA ) 75 MG CAPS capsule Take 1 capsule (75 mg total) by mouth 2 (two) times daily. FOR AFIB 05/01/20   Fargo, Amy E, NP  docusate sodium (COLACE) 100 MG capsule Take 100 mg by mouth 2 (two) times daily.    [provider]  dupilumab  (DUPIXENT ) 200 MG/1. prefilled syringe Inject 300 mg into the skin every 14 (fourteen) days. FOR ECZEMA 05/01/20   Fargo, Amy E, NP  gabapentin  (NEURONTIN ) 300 MG capsule Take 1 capsule (300 mg total) by mouth 2 (two) times daily. FOR PERIPHERAL NEUROPATHY 05/01/20    Gil No E, NP  HYDROcodone -acetaminophen  (NORCO/VICODIN) 5-325 MG tablet Take 1 tablet by mouth every 4 (four) hours as needed. 05/24/22   Tegeler, Lonni PARAS, MD  methocarbamol  (ROBAXIN ) 500 MG tablet Take 1 tablet (500 mg total) by mouth 3 (three) times daily. For muscle tightness 01/01/21   Sofia, Leslie K, PA-C  mirtazapine  (REMERON ) 30 MG tablet Take 1 tablet (30 mg total) by mouth at bedtime. 05/01/20   Fargo, Amy E, NP  Multiple Vitamins-Minerals (ICAPS AREDS 2 PO) Take by mouth. Give 1 capsule by mouth every evening    [provider]  Nutritional Supplements (ENSURE PO) Take 1 Container by mouth in the morning and at bedtime. (prefers Vanilla) d/t decreased intake. 03/05/20   [provider]  omeprazole  (PRILOSEC) 20 MG capsule Take 1 capsule (20 mg total) by mouth daily. 20 MG CAPSULE: Give 1 capsule by mouth daily 05/01/20   Fargo, Amy E, NP  polyethylene glycol (MIRALAX / GLYCOLAX) 17 g packet Take 17 g by mouth daily as needed.    [provider]  Saccharomyces boulardii (PROBIOTIC) 250 MG CAPS Take 1 capsule by mouth daily. For [employer] 05/01/20   Gil No BRAVO, NP  sertraline  (ZOLOFT ) 25 MG tablet Take 1 tablet (25 mg total) by mouth daily. Give 1 tablet by mouth daily for chronic pain and mood 05/01/20  Fargo, Amy E, NP  tamsulosin  (FLOMAX ) 0.4 MG CAPS capsule Take 1 capsule (0.4 mg total) by mouth daily. 05/01/20   Fargo, Amy E, NP  tobramycin -dexamethasone  (TOBRADEX ) ophthalmic ointment Place 1 application into both eyes 2 (two) times daily as needed. 05/01/20   Fargo, Amy E, NP  torsemide  (DEMADEX ) 20 MG tablet Take 1 tablet (20 mg total) by mouth daily. FOR HTN/CKD 05/01/20   Gil Greig BRAVO, NP    Allergies: Amoxicillin-pot clavulanate, Duloxetine hcl, Other, Pregabalin, Gluten meal, and Oxycodone    Review of Systems  Updated Vital Signs There were no vitals taken for this visit.  Physical Exam Vitals and nursing note reviewed.  Constitutional:       General: He is not in acute distress.    Appearance: He is well-developed. He is not diaphoretic.  HENT:     Head: Normocephalic and atraumatic.     Nose: Nose normal.  Eyes:     General: No scleral icterus.    Extraocular Movements: Extraocular movements intact.     Conjunctiva/sclera: Conjunctivae normal.     Pupils: Pupils are equal, round, and reactive to light.  Cardiovascular:     Rate and Rhythm: Normal rate and regular rhythm.     Heart sounds: Normal heart sounds.  Pulmonary:     Effort: Pulmonary effort is normal. Tachypnea present. No respiratory distress.     Breath sounds: Normal breath sounds.  Abdominal:     Palpations: Abdomen is soft.     Tenderness: There is abdominal tenderness.     Hernia: A hernia is present.     Comments: Distended abdomen with soft, reducible, umbilical hernia mild generalized tenderness  Musculoskeletal:     Cervical back: Normal range of motion and neck supple.     Right lower leg: Edema present.     Left lower leg: Edema present.     Comments: Bilateral lower extremity pitting edema with signs of chronic stasis dermatitis.  Multiple ulcerations on the legs do not appear overtly infected.  Skin:    General: Skin is warm and dry.  Neurological:     Mental Status: He is alert.     Comments: Normal flexion extension and strength at the ankles bilaterally.  Psychiatric:        Behavior: Behavior normal.     (all labs ordered are listed, but only abnormal results are displayed) Labs Reviewed - No data to display  EKG: None  Radiology: No results found.   Procedures   Medications Ordered in the ED - No data to display  Clinical Course as of 12/07/23 2155  Mon Dec 07, 2023  1500 Sodium(!): 129 Sodium 129 appears that has been the slope in the past. [AH]  1500 Hemoglobin(!): 10.3 Chronic anemia present unchanged [AH]  1501 Pro Brain Natriuretic Peptide(!): 10,592.0 Patient's proBNP NP elevated at 10,592 [AH]  1501  Moderately elevated troponin T [AH]  1505 DG CHEST PORT 1 VIEW Visualized and interpreted 1 view chest x-ray which shows mild pleural effusions. [AH]  1507 I reviewed outside records summary of last echocardiogram from 08/05/2022 as follows:SUMMARY  severe LAE  65mm , moderate RAE  normal LV function  moderate MR, no significant MS. s-p mitral clip.  no pericardial effusion  thickened calcified AV with mild to moderate AS, mild AI.  moderate TR with estimated PASP 53 mmHg  pleural effusion noted.   [AH]  1508 Lipase, blood [AH]  1554 Patient reevaluated bedside.  He does endorse rectal urgency  pain in his bottom and states that he has been unable to make a bowel movement in the last several days.  Patient also states that he has not been able to eat or drink because the pain has been so severe.  He is also stated he has not made urine in the last 24 hours.  I will obtain a bladder scan.  He is continuing to have severe pain. [AH]  1622 484 ml in bladder - + urinary retention- will place a foley cath.  Suspect localized inflammatory process secondary to acute proctitis [AH]    Clinical Course User Index [AH] Hayzel Ruberg, PA-C                                 Medical Decision Making Amount and/or Complexity of Data Reviewed Labs: ordered. Decision-making details documented in ED Course. Radiology: ordered. Decision-making details documented in ED Course.  Risk Prescription drug management. Decision regarding hospitalization.   This patient presents to the ED for concern of severe pain shortness of breath, this involves an extensive number of treatment options, and is a complaint that carries with it a high risk of complications and morbidity.  The emergent differential diagnosis for shortness of breath includes, but is not limited to, Pulmonary edema, bronchoconstriction, Pneumonia, Pulmonary embolism, Pneumotherax/ Hemothorax, Dysrythmia, ACS.   The emergent differential diagnosis  for back pain includes but is not limited to fracture, muscle strain, cauda equina, spinal stenosis. DDD, ankylosing spondylitis, acute ligamentous injury, disk herniation, spondylolisthesis, Epidural compression syndrome, metastatic cancer, transverse myelitis, vertebral osteomyelitis, diskitis, kidney stone, pyelonephritis, AAA, Perforated ulcer, Retrocecal appendicitis, pancreatitis, bowel obstruction, retroperitoneal hemorrhage or mass, meningitis.'  Co morbidities:  Past medical history of CHF, A-fib with Watchman procedure, neurogenic claudication and spinal stenosis  Social Determinants of Health:   patient essentially lives without any physical support normally he can take care of himself  Additional history:  {Additional history obtained from EMS at bedside   Lab Tests:  I Ordered, and personally interpreted labs.  The pertinent results include:   Labs reviewed urine appears to be infected.  Troponin is elevated.  BNP significantly elevated.  CBC with normocytic anemia, CMP with mild hyponatremia of insignificant value  Imaging Studies:  I ordered imaging studies including CT abdomen and pelvis with contrast I independently visualized and interpreted imaging which showed rectal wall thickening with surrounding inflammation suggestive of proctitis.  Bilateral pleural effusions with cardiomegaly, hernia without obstruction I agree with the radiologist interpretation  Cardiac Monitoring/ECG:  The patient was maintained on a cardiac monitor.  I personally viewed and interpreted the cardiac monitored which showed an underlying rhythm of: Sinus rhythm  Medicines ordered and prescription drug management:  I ordered medication including  Medications  sertraline  (ZOLOFT ) tablet 25 mg (has no administration in time range)  pantoprazole  (PROTONIX ) EC tablet 40 mg (has no administration in time range)  gabapentin  (NEURONTIN ) capsule 100 mg (100 mg Oral Given 12/07/23 2126)   enoxaparin (LOVENOX) injection 40 mg (40 mg Subcutaneous Given 12/07/23 2126)  furosemide  (LASIX ) injection 40 mg (has no administration in time range)  sodium chloride  flush (NS) 0.9 % injection 3 mL (3 mLs Intravenous Given 12/07/23 2127)  acetaminophen  (TYLENOL ) tablet 650 mg (has no administration in time range)    Or  acetaminophen  (TYLENOL ) suppository 650 mg (has no administration in time range)  oxyCODONE (Oxy IR/ROXICODONE) immediate release tablet 5 mg (5 mg Oral Given  12/07/23 2126)  fentaNYL (SUBLIMAZE) injection 12.5-50 mcg (50 mcg Intravenous Given 12/07/23 1920)  polyethylene glycol (MIRALAX / GLYCOLAX) packet 17 g (has no administration in time range)  bisacodyl (DULCOLAX) EC tablet 5 mg (has no administration in time range)  prochlorperazine (COMPAZINE) injection 5 mg (has no administration in time range)  Chlorhexidine Gluconate Cloth 2 % PADS 6 each (6 each Topical Given 12/07/23 1937)  fentaNYL (SUBLIMAZE) injection 50 mcg (50 mcg Intravenous Given 12/07/23 1316)  ondansetron  (ZOFRAN ) injection 4 mg (4 mg Intravenous Given 12/07/23 1317)  iohexol (OMNIPAQUE) 300 MG/ML solution 100 mL (100 mLs Intravenous Contrast Given 12/07/23 1428)  furosemide  (LASIX ) injection 20 mg (20 mg Intravenous Given 12/07/23 1640)  morphine  (PF) 4 MG/ML injection 4 mg (4 mg Intravenous Given 12/07/23 1637)   for pain control Reevaluation of the patient after these medicines showed that the patient improved I have reviewed the patients home medicines and have made adjustments as needed  Test Considered:    Critical Interventions:    Consultations Obtained: Dr.Opyd for admission  Problem List / ED Course:     ICD-10-CM   1. Proctitis  K62.89     2. Generalized weakness  R53.1     3. Peripheral edema  R60.0     4. Venous stasis ulcer of other part of lower leg, unspecified laterality, unspecified ulcer stage, unspecified whether varicose veins present (HCC)  I83.008    L97.809     5.  Elevated troponin  R79.89     6. Pleural effusion  J90     7. Severe back pain  M54.9       MDM: This is an 88 year old male who lives with a roommate who has poor ambulatory status and is unable to help him with physical needs is been unable to get out of his bed for the past few days.  He has severe pain.  After all data points reviewed I suspect that his pain is actually due to severe proctitis he had associated urinary retention requiring catheterization.  Inflammatory process may also be exacerbating his known spinal stenosis.  Patient also appears to be volume overloaded and I have ordered IV Lasix .  He will need admission for further management.   Dispostion:  After consideration of the diagnostic results and the patients response to treatment, I feel that the patent would benefit from admission.      Final diagnoses:  None    ED Discharge Orders     None          Arloa Chroman, DEVONNA 12/07/23 2159    Randol Simmonds, MD 12/09/23 [phone]

## 2023-12-08 ENCOUNTER — Inpatient Hospital Stay (HOSPITAL_COMMUNITY)

## 2023-12-08 DIAGNOSIS — I5033 Acute on chronic diastolic (congestive) heart failure: Secondary | ICD-10-CM | POA: Diagnosis not present

## 2023-12-08 LAB — ECHOCARDIOGRAM COMPLETE
AR max vel: 1.31 cm2
AV Area VTI: 1.11 cm2
AV Area mean vel: 1.3 cm2
AV Mean grad: 19 mmHg
AV Peak grad: 36.9 mmHg
Ao pk vel: 3.04 m/s
Area-P 1/2: 2.99 cm2
Calc EF: 64.9 %
MV VTI: 1.53 cm2
S' Lateral: 3 cm
Single Plane A2C EF: 60.8 %
Single Plane A4C EF: 67.1 %

## 2023-12-08 LAB — CBG MONITORING, ED
Glucose-Capillary: 37 mg/dL — CL (ref 70–99)
Glucose-Capillary: 116 mg/dL — ABNORMAL HIGH (ref 70–99)
Glucose-Capillary: 75 mg/dL (ref 70–99)

## 2023-12-08 LAB — CBC
HCT: 34 % — ABNORMAL LOW (ref 39.0–52.0)
Hemoglobin: 10.6 g/dL — ABNORMAL LOW (ref 13.0–17.0)
MCH: 29.2 pg (ref 26.0–34.0)
MCHC: 31.2 g/dL (ref 30.0–36.0)
MCV: 93.7 fL (ref 80.0–100.0)
Platelets: 287 K/uL (ref 150–400)
RBC: 3.63 MIL/uL — ABNORMAL LOW (ref 4.22–5.81)
RDW: 14.3 % (ref 11.5–15.5)
WBC: 7.1 K/uL (ref 4.0–10.5)
nRBC: 0 % (ref 0.0–0.2)

## 2023-12-08 LAB — BASIC METABOLIC PANEL WITH GFR
Anion gap: 10 (ref 5–15)
BUN: 19 mg/dL (ref 8–23)
CO2: 21 mmol/L — ABNORMAL LOW (ref 22–32)
Calcium: 8.8 mg/dL — ABNORMAL LOW (ref 8.9–10.3)
Chloride: 98 mmol/L (ref 98–111)
Creatinine, Ser: 1.05 mg/dL (ref 0.61–1.24)
GFR, Estimated: >60 mL/min (ref >60)
Glucose, Bld: 40 mg/dL — CL (ref 70–99)
Potassium: 4.6 mmol/L (ref 3.5–5.1)
Sodium: 129 mmol/L — ABNORMAL LOW (ref 135–145)

## 2023-12-08 MED ORDER — DEXTROSE 50 % IV SOLN
1.0000 | Freq: Once | INTRAVENOUS | Status: AC
  Administered 2023-12-08: 50 mL via INTRAVENOUS
  Filled 2023-12-08: qty 50

## 2023-12-08 MED ORDER — INFLUENZA VAC SPLIT HIGH-DOSE 0.5 ML IM SUSY
0.5000 mL | PREFILLED_SYRINGE | INTRAMUSCULAR | Status: DC
  Filled 2023-12-08: qty 0.5

## 2023-12-08 MED ORDER — SERTRALINE HCL 100 MG PO TABS
50.0000 mg | ORAL_TABLET | Freq: Every day | ORAL | Status: DC
Start: 2023-12-09 — End: 2023-12-11
  Administered 2023-12-09: 50 mg via ORAL
  Filled 2023-12-08: qty 1

## 2023-12-08 MED ORDER — POLYETHYLENE GLYCOL 3350 17 G PO PACK
17.0000 g | PACK | Freq: Two times a day (BID) | ORAL | Status: AC
  Administered 2023-12-08 – 2023-12-09 (×2): 17 g via ORAL
  Filled 2023-12-08 (×3): qty 1

## 2023-12-08 MED ORDER — COLLAGENASE 250 UNIT/GM EX OINT
TOPICAL_OINTMENT | Freq: Every day | CUTANEOUS | Status: DC
  Filled 2023-12-08: qty 30

## 2023-12-08 MED ORDER — PANTOPRAZOLE SODIUM 40 MG PO TBEC
40.0000 mg | DELAYED_RELEASE_TABLET | Freq: Two times a day (BID) | ORAL | Status: DC
  Administered 2023-12-08 – 2023-12-09 (×3): 40 mg via ORAL
  Filled 2023-12-08 (×3): qty 1

## 2023-12-08 MED ORDER — PNEUMOCOCCAL 20-VAL CONJ VACC 0.5 ML IM SUSY
0.5000 mL | PREFILLED_SYRINGE | INTRAMUSCULAR | Status: DC
  Filled 2023-12-08: qty 0.5

## 2023-12-08 NOTE — Consult Note (Addendum)
 WOC Nurse Consult Note: Reason for Consult: multiple wounds present on admission  Wound type: 1.  Stage 3 Pressure Injury  Sacrum/L buttock   2.  Full thickness B lower extremity wounds of unknown etiology ? Trauma  Pressure Injury POA: Yes Measurement: see nursing flowsheet  Wound bed: B lower extremity wounds dry brown necrotic tissue; sacrum 2 areas of dark hemorrhagic tissue  Drainage (amount, consistency, odor) appear dry  Periwound: evidence of deep tissue pressure injury sacrum that has evolved to Stage 3  Dressing procedure/placement/frequency:  Cleanse B lower leg wounds with NS, apply  1/4 thick layer of Santyl to wound bed, top with saline moist gauze dry gauze and silicone foam or ABD pad and Kerlix roll gauze whichever is preferred.  Cleanse sacral/buttock wound with NS, apply Xeroform gauze Soila [phone]) to wound bed daily and secure with silicone foam.   POC discussed with bedside nurse. WOC team will not follow. Reconsult if furthre needs arise.   Thank you,    Powell Bar MSN, RN-BC, [employer]

## 2023-12-08 NOTE — Plan of Care (Signed)

## 2023-12-08 NOTE — Progress Notes (Signed)
 [facility] PROGRESS NOTE    Progress Note  Gabriel Ayala  FMW:[id number] DOB: 12/16/1935 DOA: 12/07/2023 PCP: [name], MD     Brief Narrative:   Gabriel Ayala is an 88 y.o. male past medical history significant for neuropathy, chronic HFpEF, atrial fibrillation status post Watchman procedure, severe mitral regurgitation status post MitraClip comes in complaining of generalized weakness, nausea vomiting difficulty voiding that started 3 days prior to admission, in the ED was found to have satting in the 90s, sodium of 129 unremarkable with blood cell count, hemoglobin of 10 proBNP greater than 2000, CT abdomen pelvis showed rectal wall thickening with rectal proctitis bilateral pleural effusion.  Assessment/Plan:   Acute on chronic heart failure with preserved ejection fraction (HFpEF)/bilateral pleural effusion: Troponins are basically flat.  He denies any chest pain, but he does have some orthopnea. Chest x-ray showed bilateral pleural effusions with bibasilar opacities with elevated BNP. Positive JVD crackles at bases bilaterally. She was started on IV Lasix , monitor strict I's and O's and daily weights. He appears fluid overloaded on physical exam.  Dysphagia: He does have a history of esophageal stricture for which she had an EGD in 08/05/2022 with dilation To solids and liquids get a barium esophagus. Started on Protonix  p.o. twice daily.  Hypervolemic hyponatremia: Continue IV diuresis. Possibly can start Aldactone.  Generalized weakness: No focal deficit consult PT OT.  Paroxysmal atrial fibrillation: Status post Watchman procedure not on anticoagulation.  Chronic leg wounds: Continue wound care.    Chronic right hip pain: Patient and healthcare power of attorney tell me that he has been having right hip pain for over 5 years. Consult PT OT. Anticipate will need skilled nursing facility placement. Start him on narcotics. Start a bowel  regimen.  Questionable proctitis: Appreciated on imaging, no diarrhea afebrile with no leukocytosis follow-up PCP as an outpatient.  Acute urinary retention: Foley placed in the ED continue Foley strict I's and O's and daily weights can probably discontinue in 2 to 3 days when she is up and walking.    DVT prophylaxis: lovenox Family Communication: Healthcare power of attorney [phone] Status is: Inpatient Remains inpatient appropriate because: Acute decompensated heart failure    Code Status:     Code Status Orders  (From admission, onward)           Start     Ordered   12/07/23 1808  Do not attempt resuscitation (DNR)- Limited -Do Not Intubate (DNI)  Continuous       Question Answer Comment  If pulseless and not breathing No CPR or chest compressions.   In Pre-Arrest Conditions (Patient Is Breathing and Has A Pulse) Do not intubate. Provide all appropriate non-invasive medical interventions. Avoid ICU transfer unless indicated or required.   Consent: Discussion documented in EHR or advanced directives reviewed      12/07/23 1810           Code Status History     Date Active Date Inactive Code Status Order ID Comments User Context   03/17/2020 1402 01/01/2021 1121 DNR [id number]  [facility]         IV Access:   Peripheral IV   Procedures and diagnostic studies:   CT ABDOMEN PELVIS W CONTRAST Result Date: 12/07/2023 CLINICAL DATA:  Acute abdominal pain EXAM: CT ABDOMEN AND PELVIS WITH CONTRAST TECHNIQUE: Multidetector CT imaging of the abdomen and pelvis was performed using the standard protocol following bolus administration of intravenous contrast. RADIATION DOSE REDUCTION:  This exam was performed according to the departmental dose-optimization program which includes automated exposure control, adjustment of the mA and/or kV according to patient size and/or use of iterative reconstruction technique. CONTRAST:  100mL OMNIPAQUE IOHEXOL 300  MG/ML  SOLN COMPARISON:  CT abdomen and pelvis 03/11/2020. FINDINGS: Lower chest: Small left and moderate right pleural effusions are present with bibasilar atelectasis. The heart is enlarged. Hepatobiliary: No focal liver abnormality is seen. No gallstones, gallbladder wall thickening, or biliary dilatation. Pancreas: Unremarkable. No pancreatic ductal dilatation or surrounding inflammatory changes. Spleen: Normal in size without focal abnormality. Adrenals/Urinary Tract: The bilateral adrenal glands are within normal limits. There scattered rounded hypodensities in both kidneys which are too small to characterize there is a 2.3 cm cyst in the right kidney. There are additional mildly hyperdense rounded areas which are too small to characterize which may represent complex cysts. There is mild bilateral renal atrophy. There is no hydronephrosis. The bladder is within normal limits. Stomach/Bowel: There is rectal wall thickening with surrounding inflammation. There is no evidence for pneumatosis, free air or bowel obstruction. Scattered colonic diverticula are present. The appendix is not definitely visualized. Stomach is within normal limits. Vascular/Lymphatic: Aortic atherosclerosis. No enlarged abdominal or pelvic lymph nodes. Reproductive: Prostate is unremarkable. Other: There is bulging of the anterior abdominal wall. There is a anterior left lower quadrant ventral hernia containing nondilated colon which appears similar to prior study. There is no free fluid or free air. Musculoskeletal: Degenerative changes affect the spine. There is chronic compression deformity of T12. Right hip arthroplasty is present. IMPRESSION: 1. Rectal wall thickening with surrounding inflammation compatible with proctitis. 2. Colonic diverticulosis. 3. Bilateral pleural effusions, right greater than left. 4. Cardiomegaly. 5. Stable left lower quadrant ventral hernia containing nondilated colon. 6. Aortic atherosclerosis. Aortic  Atherosclerosis (ICD10-I70.0). Electronically Signed   By: Greig Pique M.D.   On: 12/07/2023 15:11   DG CHEST PORT 1 VIEW Result Date: 12/07/2023 CLINICAL DATA:  Body aches.  Nausea. EXAM: PORTABLE CHEST 1 VIEW COMPARISON:  08/01/2022. FINDINGS: The heart size and mediastinal contours are unchanged. Left atrial appendage occlusion device is again noted. Aortic atherosclerosis. Small to moderate left-greater-than-right pleural effusions with bibasilar opacities favored to reflect atelectasis, not significantly changed compared to the prior exam. No pneumothorax. No acute osseous abnormality. IMPRESSION: Small to moderate left-greater-than-right pleural effusions with bibasilar opacities favored to reflect atelectasis, not significantly changed compared to the prior exam. Electronically Signed   By: Harrietta Sherry M.D.   On: 12/07/2023 13:40     Medical Consultants:   None.   Subjective:    Gabriel Ayala complaining of right hip pain.  Objective:    Vitals:   12/07/23 2200 12/08/23 0052 12/08/23 0250 12/08/23 0630  BP: (!) 141/63  (!) 136/55 (!) 133/50  Pulse: (!) 55  79 76  Resp: (!) 23  16 19   Temp:  (!) 97.4 F (36.3 C) (!) 97.5 F (36.4 C) (!) 97.5 F (36.4 C)  TempSrc:  Oral    SpO2: 100%  100% 94%   SpO2: 94 % O2 Flow Rate (L/min): 2 L/min  No intake or output data in the 24 hours ending 12/08/23 0637 There were no vitals filed for this visit.  Exam: General exam: In no acute distress. Respiratory system: Good air movement and bilateral crackles at bases but more predominant in the right Cardiovascular system: S1 & S2 heard, RRR.  Positive JVD Gastrointestinal system: Abdomen is nondistended, soft and nontender.  Extremities: No  pedal edema. Skin: No rashes, lesions or ulcers Psychiatry: Judgement and insight appear normal. Mood & affect appropriate.    Data Reviewed:    Labs: Basic Metabolic Panel: Recent Labs  Lab 12/07/23 1320 12/08/23 0500  NA  129* 129*  K 4.5 4.6  CL 97* 98  CO2 22 21*  GLUCOSE 81 40*  BUN 17 19  CREATININE 0.92 1.05  CALCIUM 9.4 8.8*   GFR CrCl cannot be calculated (Unknown ideal weight.). Liver Function Tests: Recent Labs  Lab 12/07/23 1320  AST 12*  ALT 8  ALKPHOS 113  BILITOT 0.6  PROT 6.2*  ALBUMIN 3.9   Recent Labs  Lab 12/07/23 1320  LIPASE 11   No results for input(s): AMMONIA in the last 168 hours. Coagulation profile No results for input(s): INR, PROTIME in the last 168 hours. COVID-19 Labs  No results for input(s): DDIMER, FERRITIN, LDH, CRP in the last 72 hours.  Lab Results  Component Value Date   SARSCOV2NAA NEGATIVE 01/31/2022    CBC: Recent Labs  Lab 12/07/23 1320 12/08/23 0500  WBC 6.2 7.1  NEUTROABS 4.6  --   HGB 10.3* 10.6*  HCT 33.5* 34.0*  MCV 91.8 93.7  PLT 299 287   Cardiac Enzymes: No results for input(s): CKTOTAL, CKMB, CKMBINDEX, TROPONINI in the last 168 hours. BNP (last 3 results) Recent Labs    12/07/23 1320  PROBNP 10,592.0*   CBG: Recent Labs  Lab 12/08/23 0631  GLUCAP 37*   D-Dimer: No results for input(s): DDIMER in the last 72 hours. Hgb A1c: No results for input(s): HGBA1C in the last 72 hours. Lipid Profile: No results for input(s): CHOL, HDL, LDLCALC, TRIG, CHOLHDL, LDLDIRECT in the last 72 hours. Thyroid function studies: No results for input(s): TSH, T4TOTAL, T3FREE, THYROIDAB in the last 72 hours.  Invalid input(s): FREET3 Anemia work up: No results for input(s): VITAMINB12, FOLATE, FERRITIN, TIBC, IRON, RETICCTPCT in the last 72 hours. Sepsis Labs: Recent Labs  Lab 12/07/23 1320 12/08/23 0500  WBC 6.2 7.1   Microbiology No results found for this or any previous visit (from the past 240 hours).   Medications:    Chlorhexidine Gluconate Cloth  6 each Topical Daily   dextrose  1 ampule Intravenous Once   enoxaparin (LOVENOX) injection  40 mg  Subcutaneous QHS   furosemide   40 mg Intravenous Q12H   gabapentin   100 mg Oral TID   pantoprazole   40 mg Oral Daily   sertraline   25 mg Oral Daily   sodium chloride  flush  3 mL Intravenous Q12H   Continuous Infusions:    LOS: 1 day   Gabriel Ayala  [facility]  12/08/2023, 6:37 AM

## 2023-12-08 NOTE — Progress Notes (Signed)
 Heart Failure Navigator Progress Note  Assessed for Heart & Vascular TOC clinic readiness.  Patient does not meet criteria due to patient is seen by [facility]. No HF TOC. .   Navigator will sign off at this time.   Randie Bustle, BSN, Scientist, clinical (histocompatibility and immunogenetics) Only

## 2023-12-08 NOTE — Progress Notes (Signed)
  Echocardiogram 2D Echocardiogram has been performed.  Gabriel Ayala 12/08/2023, 12:57 PM

## 2023-12-08 NOTE — Progress Notes (Signed)
  PT Cancellation Note  Patient Details Name: Gabriel Ayala MRN: [id number] DOB: 12-04-1935   Cancelled Treatment:    Reason Eval/Treat Not Completed: Medical issues which prohibited therapy Low CBG. PT will check back  another time.  Darice Potters PT Acute Rehabilitation Services Office [phone]   Potters Darice Norris 12/08/2023, 9:56 AM

## 2023-12-09 DIAGNOSIS — E871 Hypo-osmolality and hyponatremia: Secondary | ICD-10-CM | POA: Diagnosis not present

## 2023-12-09 DIAGNOSIS — J90 Pleural effusion, not elsewhere classified: Secondary | ICD-10-CM | POA: Diagnosis not present

## 2023-12-09 DIAGNOSIS — R531 Weakness: Secondary | ICD-10-CM | POA: Diagnosis not present

## 2023-12-09 DIAGNOSIS — I5033 Acute on chronic diastolic (congestive) heart failure: Secondary | ICD-10-CM | POA: Diagnosis not present

## 2023-12-09 LAB — CBC
HCT: 33.8 % — ABNORMAL LOW (ref 39.0–52.0)
Hemoglobin: 10.1 g/dL — ABNORMAL LOW (ref 13.0–17.0)
MCH: 28.1 pg (ref 26.0–34.0)
MCHC: 29.9 g/dL — ABNORMAL LOW (ref 30.0–36.0)
MCV: 94.2 fL (ref 80.0–100.0)
Platelets: 260 K/uL (ref 150–400)
RBC: 3.59 MIL/uL — ABNORMAL LOW (ref 4.22–5.81)
RDW: 14.4 % (ref 11.5–15.5)
WBC: 7.3 K/uL (ref 4.0–10.5)
nRBC: 0 % (ref 0.0–0.2)

## 2023-12-09 LAB — BASIC METABOLIC PANEL WITH GFR
Anion gap: 12 (ref 5–15)
BUN: 24 mg/dL — ABNORMAL HIGH (ref 8–23)
CO2: 20 mmol/L — ABNORMAL LOW (ref 22–32)
Calcium: 8.8 mg/dL — ABNORMAL LOW (ref 8.9–10.3)
Chloride: 98 mmol/L (ref 98–111)
Creatinine, Ser: 1.84 mg/dL — ABNORMAL HIGH (ref 0.61–1.24)
GFR, Estimated: 35 mL/min — ABNORMAL LOW (ref >60)
Glucose, Bld: 90 mg/dL (ref 70–99)
Potassium: 4.9 mmol/L (ref 3.5–5.1)
Sodium: 130 mmol/L — ABNORMAL LOW (ref 135–145)

## 2023-12-09 LAB — GLUCOSE, CAPILLARY: Glucose-Capillary: 88 mg/dL (ref 70–99)

## 2023-12-09 MED ORDER — ENOXAPARIN SODIUM 30 MG/0.3ML IJ SOSY
30.0000 mg | PREFILLED_SYRINGE | Freq: Every day | INTRAMUSCULAR | Status: DC
  Administered 2023-12-09 – 2023-12-10 (×2): 30 mg via SUBCUTANEOUS
  Filled 2023-12-09 (×2): qty 0.3

## 2023-12-09 MED ORDER — FUROSEMIDE 10 MG/ML IJ SOLN
80.0000 mg | Freq: Once | INTRAMUSCULAR | Status: AC
  Administered 2023-12-09: 80 mg via INTRAVENOUS
  Filled 2023-12-09: qty 8

## 2023-12-09 NOTE — Evaluation (Signed)
 Clinical/Bedside Swallow Evaluation Patient Details  Name: Gabriel Ayala MRN: [id number] Date of Birth: January 12, 1936  Today's Date: 12/09/2023 Time: SLP Start Time (ACUTE ONLY): 1127 SLP Stop Time (ACUTE ONLY): 1155 SLP Time Calculation (min) (ACUTE ONLY): 28 min  Past Medical History:  Past Medical History:  Diagnosis Date   A-fib (HCC)    CHF (congestive heart failure) (HCC)    Neuropathy    Past Surgical History:  Past Surgical History:  Procedure Laterality Date   APPENDECTOMY     BACK SURGERY     EYE SURGERY     detached retina   NASAL RECONSTRUCTION     PROSTATE SURGERY     HPI:  Patient is an 88 y.o. male with medical history significant for GERD, neuropathy, chronic HFpEE, atrial fibrillation status post Watchman procedure, and severe mitral regurgitation status post MitraClip who presented to [facility] on 9/29 with multiple complaints including generalized weakness, aches and pains, nausea, constipation, and difficulty voiding over the past 3 days. Patient had MBS in 5/24 at outside hospital, where he presented with an oral and pharyngeal dysphagia with a high risk for aspiration and penetration. Patient had EGD on 5/28 at outside hospital which showed slight narrowing at the upper esophagus and GE junction which were dialated. Swallow evaluation order placed on 10/1 to assess swallow function.    Assessment / Plan / Recommendation  Clinical Impression  Patient presented with lethargy/drowsiness. He was verbally responsive most of the time, occassional tactile stimulation needed. Patient complained of buring back pain when SLP attempted to raise bed for oral care. SLP kept patient partially reclined. Nurse notified and indicated that patient was given pain relief cream this morning. Oral care provided. Cleared diffuse suspected secretions vs. food particles vs. both. Patient had a discoordinated and delayed swallow when given a sip of thin liquid. No coughing was observed. Towards  the end of session, patient began to fall asleep. SLP recommending NPO, but allow for water sips/ice chips PRN. SLP will continue to follow for PO readiness. SLP Visit Diagnosis: Dysphagia, oropharyngeal phase (R13.12)    Aspiration Risk       Diet Recommendation NPO;Ice chips PRN after oral care;Free water protocol after oral care    Liquid Administration via: Straw Medication Administration: Crushed with puree (necessary meds crushed in puree.)    Other  Recommendations Oral Care Recommendations: Oral care QID;Oral care prior to ice chip/H20;Staff/trained caregiver to provide oral care     Assistance Recommended at Discharge    Functional Status Assessment Patient has had a recent decline in their functional status and/or demonstrates limited ability to make significant improvements in function in a reasonable and predictable amount of time  Frequency and Duration min 2x/week  1 week       Prognosis Prognosis for improved oropharyngeal function: Fair      Swallow Study   General Date of Onset: 12/09/23 HPI: Patient is an 88 y.o. male with medical history significant for GERD, neuropathy, chronic HFpEE, atrial fibrillation status post Watchman procedure, and severe mitral regurgitation status post MitraClip who presented to [facility] on 9/29 with multiple complaints including generalized weakness, aches and pains, nausea, constipation, and difficulty voiding over the past 3 days. Patient had MBS in 5/24 at outside hospital, where he presented with an oral and pharyngeal dysphagia with a high risk for aspiration and penetration. Patient had EGD on 5/28 at outside hospital which showed slight narrowing at the upper esophagus and GE junction which were dialated. Swallow evaluation  order placed on 10/1 to assess swallow function. Type of Study: Bedside Swallow Evaluation Diet Prior to this Study: NPO Temperature Spikes Noted: No Respiratory Status: Nasal cannula History of Recent Intubation:  No Behavior/Cognition: Lethargic/Drowsy;Requires cueing Oral Cavity Assessment: Dry;Dried secretions Oral Care Completed by SLP: Yes Oral Cavity - Dentition: Missing dentition Patient Positioning: Partially reclined (Had back pain when SLP tried to sit the bed up.) Volitional Swallow: Able to elicit    Oral/Motor/Sensory Function Overall Oral Motor/Sensory Function: Within functional limits   Ice Chips     Thin Liquid Thin Liquid: Impaired Presentation: Straw Oral Phase Impairments: Poor awareness of bolus Pharyngeal  Phase Impairments: Other (comments);Suspected delayed Swallow (Discoordinated and delayed swallow)    Nectar Thick     Honey Thick     Puree     Solid           Damien Hy  Graduate SLP Clinican

## 2023-12-09 NOTE — Progress Notes (Addendum)
 [facility] PROGRESS NOTE    Progress Note  Gabriel Ayala  FMW:[id number] DOB: 16-Mar-1935 DOA: 12/07/2023 PCP: [name], MD     Brief Narrative:   Gabriel Ayala is an 88 y.o. male past medical history significant for neuropathy, chronic HFpEF, atrial fibrillation status post Watchman procedure, severe mitral regurgitation status post MitraClip comes in complaining of generalized weakness, nausea vomiting difficulty voiding that started 3 days prior to admission, in the ED was found to have satting in the 90s, sodium of 129 unremarkable with blood cell count, hemoglobin of 10 proBNP greater than 2000, CT abdomen pelvis showed rectal wall thickening with rectal proctitis bilateral pleural effusion.  Assessment/Plan:   Acute on chronic heart failure with preserved ejection fraction (HFpEF)/bilateral pleural effusion: Troponins are basically flat.  He denies any chest pain, but he does have some orthopnea. Chest x-Ayala showed bilateral pleural effusions with bibasilar opacities with elevated BNP. Positive JVD crackles at bases bilaterally. Patient was started on IV furosemide  every 12 hours.   Continue strict ins and outs and daily weights. Echocardiogram shows normal LVEF.  Creatinine noted to be higher today. Not much UO since last night. BNP noted to be elevated. Will give higher dose of lasix . Recheck BMET in AM.  Acute respiratory failure with hypoxia Noted to be on 4 to 5 L of oxygen by nasal cannula.  Likely due to volume overload and pleural effusions.  Continue to monitor.  Dysphagia: He does have a history of esophageal stricture for which he had an EGD in 08/05/2022 with dilation.  Looks like this was done in [facility].  According to the report there was slight narrowing at the GE junction and in the upper esophagus just distal to the upper esophageal sphincter muscle.  A dilator was passed which went through easily without any resistance. Will have speech  therapy see the patient.  May need to involve gastroenterology however patient's respiratory status is tenuous at this time.  May not be able to undergo EGD safely.  Hypervolemic hyponatremia: Labs are pending from today.  Check sodium levels daily.  Generalized weakness: PT and OT.  Paroxysmal atrial fibrillation: Status post Watchman procedure not on anticoagulation.  Chronic leg wounds: Continue wound care.    Chronic right hip pain: Patient and healthcare power of attorney tell me that he has been having right hip pain for over 5 years.  Questionable proctitis: Appreciated on imaging, no diarrhea afebrile with no leukocytosis follow-up PCP as an outpatient.  Acute urinary retention: Foley placed in the ED continue Foley strict I's and O's and daily weights can probably discontinue in 2 to 3 days when she is up and walking.  Goals of care Discussed with patient's healthcare power of attorney who is his caregiver as well.  Patient has 2 children one of them is in [place] and the other is in [place] .  Power of attorney mentioned that patient has been doing fairly well and has been independent up until a few weeks ago.  He has chronic hip pain as a result of which his ambulatory status is poor but he still is able to walk at times.  He bathes himself. So at this time we will proceed with treatment as outlined above and see if he progresses.  If he does not progress then at that time palliative care can be consulted but will hold off for now. He is already a DNR.   DVT prophylaxis: lovenox Family Communication: Healthcare power of attorney  [phone] CODE STATUS: DNR Disposition: To be determined   IV Access:   Peripheral IV   Procedures and diagnostic studies:   ECHOCARDIOGRAM COMPLETE Result Date: 12/08/2023    ECHOCARDIOGRAM REPORT   Patient Name:   Gabriel Ayala Date of Exam: 12/08/2023 Medical Rec #:  [id number]        Height:       72.0 in Accession #:     [id number]       Weight:       180.0 lb Date of Birth:  01-Nov-1935        BSA:          2.037 m Patient Age:    87 years         BP:           133/50 mmHg Patient Gender: M                HR:           62 bpm. Exam Location:  Inpatient Procedure: 2D Echo, Cardiac Doppler and Color Doppler (Both Spectral and Color            Flow Doppler were utilized during procedure). Indications:    I50.40* Unspecified combined systolic (congestive) and diastolic                 (congestive) heart failure  History:        Patient has no prior history of Echocardiogram examinations.                 CHF, Abnormal ECG, Mitral Valve Disease, Arrythmias:Atrial                 Fibrillation; Risk Factors:Hypertension and Dyslipidemia.                 HefPef. Watchman device. Mitraclip procedure.  Sonographer:    Ellouise Mose RDCS Referring Phys: [id number] TIMOTHY S OPYD IMPRESSIONS  1. Left ventricular ejection fraction, by estimation, is 60 to 65%. The left ventricle has normal function. The left ventricle has no regional wall motion abnormalities. There is mild left ventricular hypertrophy. Left ventricular diastolic function could not be evaluated.  2. Right ventricular systolic function is low normal. The right ventricular size is normal. There is moderately elevated pulmonary artery systolic pressure. The estimated right ventricular systolic pressure is 49.5 mmHg.  3. Left atrial size was severely dilated.  4. Right atrial size was moderately dilated.  5. Large pleural effusion.  6. The mitral valve is abnormal. Mild mitral valve regurgitation. Procedure Date: 07/17/2021.  7. The aortic valve is calcified. Aortic valve regurgitation is trivial. Moderate aortic valve stenosis.  8. Aortic dilatation noted. There is mild dilatation of the aortic root, measuring 39 mm.  9. The inferior vena cava is normal in size with <50% respiratory variability, suggesting right atrial pressure of 8 mmHg. Comparison(s): A prior study was performed on  07/28/2022. THe ejection fraction was 60-65% with mild aortic stenosis, mild aortic regurgitation, moderate mitral regurgitation and RVSP 53 mmHg. No other significant changes. FINDINGS  Left Ventricle: Left ventricular ejection fraction, by estimation, is 60 to 65%. The left ventricle has normal function. The left ventricle has no regional wall motion abnormalities. The left ventricular internal cavity size was normal in size. There is  mild left ventricular hypertrophy. Left ventricular diastolic function could not be evaluated. Right Ventricle: The right ventricular size is normal. No increase in right ventricular wall thickness. Right ventricular systolic function is  low normal. There is moderately elevated pulmonary artery systolic pressure. The tricuspid regurgitant velocity  is 3.22 m/s, and with an assumed right atrial pressure of 8 mmHg, the estimated right ventricular systolic pressure is 49.5 mmHg. Left [facility]: Left atrial size was severely dilated. Right [facility]: Right atrial size was moderately dilated. Pericardium: There is no evidence of pericardial effusion. Mitral Valve: The mitral valve is abnormal. Mild mitral valve regurgitation. There is a Mitra-Clip present in the mitral position. MV peak gradient, 11.8 mmHg. The mean mitral valve gradient is 4.5 mmHg. Tricuspid Valve: The tricuspid valve is normal in structure. Tricuspid valve regurgitation is mild. Aortic Valve: The aortic valve is calcified. Aortic valve regurgitation is trivial. Moderate aortic stenosis is present. Aortic valve mean gradient measures 19.0 mmHg. Aortic valve peak gradient measures 36.9 mmHg. Aortic valve area, by VTI measures 1.11  cm. Pulmonic Valve: The pulmonic valve was normal in structure. Pulmonic valve regurgitation is mild. Aorta: Aortic dilatation noted. There is mild dilatation of the aortic root, measuring 39 mm. Venous: The inferior vena cava is normal in size with less than 50% respiratory variability,  suggesting right atrial pressure of 8 mmHg. IAS/Shunts: The interatrial septum was not assessed. Additional Comments: There is a large pleural effusion.  LEFT VENTRICLE PLAX 2D LVIDd:         4.80 cm LVIDs:         3.00 cm LV PW:         1.10 cm LV IVS:        1.30 cm LVOT diam:     2.40 cm LV SV:         60 LV SV Index:   30 LVOT Area:     4.52 cm  LV Volumes (MOD) LV vol d, MOD A2C: 129.0 ml LV vol d, MOD A4C: 62.4 ml LV vol s, MOD A2C: 50.6 ml LV vol s, MOD A4C: 20.5 ml LV SV MOD A2C:     78.4 ml LV SV MOD A4C:     62.4 ml LV SV MOD BP:      60.0 ml RIGHT VENTRICLE            IVC RV S prime:     9.46 cm/s  IVC diam: 2.70 cm TAPSE (M-mode): 1.5 cm LEFT ATRIUM              Index        RIGHT ATRIUM           Index LA diam:        4.40 cm  2.16 cm/m   RA Area:     23.80 cm LA Vol (A2C):   96.7 ml  47.47 ml/m  RA Volume:   59.80 ml  29.35 ml/m LA Vol (A4C):   116.0 ml 56.94 ml/m LA Biplane Vol: 117.0 ml 57.43 ml/m  AORTIC VALVE                     PULMONIC VALVE AV Area (Vmax):    1.31 cm      PR End Diast Vel: 2.16 msec AV Area (Vmean):   1.30 cm AV Area (VTI):     1.11 cm AV Vmax:           303.60 cm/s AV Vmean:          196.400 cm/s AV VTI:            0.544 m AV Peak Grad:      36.9  mmHg AV Mean Grad:      19.0 mmHg LVOT Vmax:         87.95 cm/s LVOT Vmean:        56.600 cm/s LVOT VTI:          0.134 m LVOT/AV VTI ratio: 0.25  AORTA Ao Root diam: 3.90 cm Ao Asc diam:  3.60 cm MITRAL VALVE                TRICUSPID VALVE MV Area (PHT): 2.99 cm     TR Peak grad:   41.5 mmHg MV Area VTI:   1.53 cm     TR Vmax:        322.00 cm/s MV Peak grad:  11.8 mmHg MV Mean grad:  4.5 mmHg     SHUNTS MV Vmax:       1.72 m/s     Systemic VTI:  0.13 m MV Vmean:      90.2 cm/s    Systemic Diam: 2.40 cm MV Decel Time: 254 msec MV E velocity: 159.00 cm/s Emeline Calender Electronically signed by Emeline Calender Signature Date/Time: 12/08/2023/5:06:32 PM    Final    DG ESOPHAGUS W SINGLE CM (SOL OR THIN BA) Result Date:  12/08/2023 CLINICAL DATA:  Provided history: Dysphagia. EXAM: ESOPHAGUS/BARIUM SWALLOW TECHNIQUE: A single contrast esophagram was performed using thin liquid barium. The exam was performed by Delon Beagle NP and was supervised and interpreted by Dr. Rockey Childs. FLUOROSCOPY: Radiation Exposure Index (as provided by the fluoroscopic device): 4.30 mGy Kerma COMPARISON:  None. FINDINGS: Markedly limited examination due to limited patient cooperation (with the patient only swallowing small volumes of contrast). The patient did not swallow sufficient volumes of contrast to adequately assess for an esophageal stricture. Additionally, the patient refused to swallow a tablet. Small collection of contrast along the left anterolateral aspect of the cervical esophagus most consistent with a Killian-Jamieson diverticulum. Within described limitations, no significant esophageal dysmotility or gastroesophageal reflux observed. No appreciable hiatal hernia. IMPRESSION: 1. Markedly limited examination due to limited patient cooperation, as described. The patient did not swallow sufficient volumes of contrast to adequately assess for an esophageal stricture. Additionally, the patient refused to swallow a barium tablet. 2. 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 Electronically Signed   By: Rockey Childs D.O.   On: 12/08/2023 12:54   CT ABDOMEN PELVIS W CONTRAST Result Date: 12/07/2023 CLINICAL DATA:  Acute abdominal pain EXAM: CT ABDOMEN AND PELVIS WITH CONTRAST TECHNIQUE: Multidetector CT imaging of the abdomen and pelvis was performed using the standard protocol following bolus administration of intravenous contrast. RADIATION DOSE REDUCTION: This exam was performed according to the departmental dose-optimization program which includes automated exposure control, adjustment of the mA and/or kV according to patient size and/or use of iterative reconstruction technique. CONTRAST:  100mL  OMNIPAQUE IOHEXOL 300 MG/ML  SOLN COMPARISON:  CT abdomen and pelvis 03/11/2020. FINDINGS: Lower chest: Small left and moderate right pleural effusions are present with bibasilar atelectasis. The heart is enlarged. Hepatobiliary: No focal liver abnormality is seen. No gallstones, gallbladder wall thickening, or biliary dilatation. Pancreas: Unremarkable. No pancreatic ductal dilatation or surrounding inflammatory changes. Spleen: Normal in size without focal abnormality. Adrenals/Urinary Tract: The bilateral adrenal glands are within normal limits. There scattered rounded hypodensities in both kidneys which are too small to characterize there is a 2.3 cm cyst in the right kidney. There are additional mildly hyperdense rounded areas which are too small to characterize which may represent complex cysts.  There is mild bilateral renal atrophy. There is no hydronephrosis. The bladder is within normal limits. Stomach/Bowel: There is rectal wall thickening with surrounding inflammation. There is no evidence for pneumatosis, free air or bowel obstruction. Scattered colonic diverticula are present. The appendix is not definitely visualized. Stomach is within normal limits. Vascular/Lymphatic: Aortic atherosclerosis. No enlarged abdominal or pelvic lymph nodes. Reproductive: Prostate is unremarkable. Other: There is bulging of the anterior abdominal wall. There is a anterior left lower quadrant ventral hernia containing nondilated colon which appears similar to prior study. There is no free fluid or free air. Musculoskeletal: Degenerative changes affect the spine. There is chronic compression deformity of T12. Right hip arthroplasty is present. IMPRESSION: 1. Rectal wall thickening with surrounding inflammation compatible with proctitis. 2. Colonic diverticulosis. 3. Bilateral pleural effusions, right greater than left. 4. Cardiomegaly. 5. Stable left lower quadrant ventral hernia containing nondilated colon. 6. Aortic  atherosclerosis. Aortic Atherosclerosis (ICD10-I70.0). Electronically Signed   By: Greig Pique M.D.   On: 12/07/2023 15:11   DG CHEST PORT 1 VIEW Result Date: 12/07/2023 CLINICAL DATA:  Body aches.  Nausea. EXAM: PORTABLE CHEST 1 VIEW COMPARISON:  08/01/2022. FINDINGS: The heart size and mediastinal contours are unchanged. Left atrial appendage occlusion device is again noted. Aortic atherosclerosis. Small to moderate left-greater-than-right pleural effusions with bibasilar opacities favored to reflect atelectasis, not significantly changed compared to the prior exam. No pneumothorax. No acute osseous abnormality. IMPRESSION: Small to moderate left-greater-than-right pleural effusions with bibasilar opacities favored to reflect atelectasis, not significantly changed compared to the prior exam. Electronically Signed   By: Harrietta Sherry M.D.   On: 12/07/2023 13:40     Medical Consultants:   None.   Subjective:   Patient complains of pain all over especially in his lower extremities.  Complains of gagging when he tries to eat or drink anything.  Shortness of breath is present.  Has any chest pain.  Objective:    Vitals:   12/08/23 1526 12/08/23 1939 12/09/23 0019 12/09/23 0331  BP: (!) 125/49 (!) 129/59 (!) 105/50 (!) 124/51  Pulse: 79 61 63 66  Resp: 18 14 18 16   Temp: 98.8 F (37.1 C) 97.8 F (36.6 C) 97.9 F (36.6 C) 98.1 F (36.7 C)  TempSrc: Oral Oral Oral Oral  SpO2: 99% 95% 93% 97%  Weight: 66.3 kg   66.3 kg  Height: 5' 11 (1.803 m)      SpO2: 97 % O2 Flow Rate (L/min): 5 L/min   Intake/Output Summary (Last 24 hours) at 12/09/2023 1107 Last data filed at 12/09/2023 0500 Gross per 24 hour  Intake 100 ml  Output 450 ml  Net -350 ml   Filed Weights   12/08/23 1526 12/09/23 0331  Weight: 66.3 kg 66.3 kg    Exam:  General appearance: Lethargic but arousable.  No distress. Resp: Diminished air entry at the bases with crackles.  No wheezing or rhonchi. Cardio:  S1-S2 is normal regular.  No S3-S4.  No rubs murmurs or bruit GI: Abdomen is soft.  Nontender nondistended.  Bowel sounds are present normal.  No masses organomegaly Extremities: Mild edema bilateral lower extremity.  Significant physical deconditioning noted.  Chronic skin changes over the legs.   Data Reviewed:    Labs: Basic Metabolic Panel: Recent Labs  Lab 12/07/23 1320 12/08/23 0500  NA 129* 129*  K 4.5 4.6  CL 97* 98  CO2 22 21*  GLUCOSE 81 40*  BUN 17 19  CREATININE 0.92 1.05  CALCIUM 9.4  8.8*   GFR Estimated Creatinine Clearance: 48.2 mL/min (by C-G formula based on SCr of 1.05 mg/dL).  Liver Function Tests: Recent Labs  Lab 12/07/23 1320  AST 12*  ALT 8  ALKPHOS 113  BILITOT 0.6  PROT 6.2*  ALBUMIN 3.9   Recent Labs  Lab 12/07/23 1320  LIPASE 11   CBC: Recent Labs  Lab 12/07/23 1320 12/08/23 0500 12/09/23 0456  WBC 6.2 7.1 7.3  NEUTROABS 4.6  --   --   HGB 10.3* 10.6* 10.1*  HCT 33.5* 34.0* 33.8*  MCV 91.8 93.7 94.2  PLT 299 287 260    BNP (last 3 results) Recent Labs    12/07/23 1320  PROBNP 10,592.0*   CBG: Recent Labs  Lab 12/08/23 0631 12/08/23 0733 12/08/23 1436  GLUCAP 37* 116* 75    Sepsis Labs: Recent Labs  Lab 12/07/23 1320 12/08/23 0500 12/09/23 0456  WBC 6.2 7.1 7.3    Medications:    Chlorhexidine Gluconate Cloth  6 each Topical Daily   collagenase   Topical Daily   enoxaparin (LOVENOX) injection  40 mg Subcutaneous QHS   furosemide   40 mg Intravenous Q12H   gabapentin   100 mg Oral TID   Influenza vac split trivalent PF  0.5 mL Intramuscular Tomorrow-1000   pantoprazole   40 mg Oral BID   pneumococcal 20-valent conjugate vaccine  0.5 mL Intramuscular Tomorrow-1000   polyethylene glycol  17 g Oral BID   sertraline   50 mg Oral Daily   sodium chloride  flush  3 mL Intravenous Q12H   Continuous Infusions:    LOS: 2 days   [employer]  [facility]  12/09/2023, 11:07 AM

## 2023-12-09 NOTE — TOC Initial Note (Signed)
 Transition of Care [facility]) - Initial/Assessment Note    Patient Details  Name: Gabriel Ayala MRN: [id number] Date of Birth: 1935/06/20  Transition of Care [facility]) CM/SW Contact:    Bascom Service, RN Phone Number: 12/09/2023, 1:29 PM  Clinical Narrative: Spoke to Candice(HCPOA) about d/c plans-has gone to [facility] in past. On 02. PT cons await recc.                  Expected Discharge Plan: Skilled Nursing Facility Barriers to Discharge: Continued Medical Work up   Patient Goals and CMS Choice Patient states their goals for this hospitalization and ongoing recovery are:: Rehab CMS Medicare.gov Compare Post Acute Care list provided to:: Patient Represenative (must comment) (Candice(HCPOA)) Choice offered to / list presented to : [facility] POA / Guardian [place] ownership interest in [facility].provided to:: [facility] POA / Guardian    Expected Discharge Plan and Services   Discharge Planning Services: CM Consult Post Acute Care Choice: Durable Medical Equipment, Skilled Nursing Facility Living arrangements for the past 2 months: Single Family Home                                      Prior Living Arrangements/Services Living arrangements for the past 2 months: Single Family Home Lives with:: Relatives   Do you feel safe going back to the place where you live?: Yes          Current home services: DME (rollator,w/c,lift chair)    Activities of Daily Living   ADL Screening (condition at time of admission) Independently performs ADLs?: No Does the patient have a NEW difficulty with bathing/dressing/toileting/self-feeding that is expected to last >3 days?: No Does the patient have a NEW difficulty with getting in/out of bed, walking, or climbing stairs that is expected to last >3 days?: No Does the patient have a NEW difficulty with communication that is expected to last >3 days?: No Is the patient deaf or have difficulty hearing?: Yes Does the patient have  difficulty seeing, even when wearing glasses/contacts?: No Does the patient have difficulty concentrating, remembering, or making decisions?: Yes  Permission Sought/Granted Permission sought to share information with : Case Manager Permission granted to share information with : Yes, Verbal Permission Granted              Emotional Assessment              Admission diagnosis:  Proctitis [K62.89] Peripheral edema [R60.0] Pleural effusion [J90] Elevated troponin [R79.89] Generalized weakness [R53.1] Severe back pain [M54.9] Venous stasis ulcer of other part of lower leg, unspecified laterality, unspecified ulcer stage, unspecified whether varicose veins present (HCC) [I83.008, L97.809] Acute on chronic heart failure with preserved ejection fraction (HFpEF) (HCC) [I50.33] Patient Active Problem List   Diagnosis Date Noted   Acute on chronic heart failure with preserved ejection fraction (HFpEF) (HCC) 12/07/2023   Proctitis 12/07/2023   Hyponatremia 12/07/2023   Pleural effusion, bilateral 12/07/2023   Chronic wound 12/07/2023   Status post total replacement of right hip 03/07/2020   Gastroesophageal reflux disease without esophagitis 03/07/2020   Chronic kidney disease, stage 4 (severe) (HCC) 03/07/2020   Spinal stenosis of lumbar region with radiculopathy 08/07/2018   Peripheral neuropathy 08/07/2018   Recurrent falls 08/07/2018   Essential hypertension 08/07/2018   Paroxysmal atrial fibrillation (HCC) 08/07/2018   Acute urinary retention 08/07/2018   Atopic eczema 08/07/2018   Constipation  08/07/2018   Vitamin D deficiency 08/07/2018   PCP:  [name], MD Pharmacy:   [facility] [phone] - [place], [place] - [address] AT [facility] [address] [place] [address] Phone: [phone] Fax: [phone]  [facility] - [place], [place] - [phone] [address] [place]  [address] Phone: [phone] Fax: [phone]  [id number] - [place], [place] - [address] AT [facility] OF [place] [address] & [address] [facility] [address] [place] [address] Phone: [phone] Fax: [phone]     Social Drivers of Health (SDOH) Social History: SDOH Screenings   Food Insecurity: No Food Insecurity (12/08/2023)  Housing: Low Risk  (12/08/2023)  Transportation Needs: No Transportation Needs (12/08/2023)  Utilities: Not At Risk (12/08/2023)  Social Connections: Socially Isolated (12/08/2023)  Tobacco Use: Low Risk  (12/07/2023)   SDOH Interventions:     Readmission Risk Interventions     No data to display

## 2023-12-09 NOTE — Evaluation (Signed)
 Physical Therapy Evaluation Patient Details Name: Gabriel Ayala MRN: [id number] DOB: 01-Apr-1935 Today's Date: 12/09/2023  History of Present Illness  88 yo male admitted with acute with weakness, N/V, acute on chronic HF, hyponatremia. Hx of neuropathy, HF, Afib, chronic wounds, sever MR, chronic hip pain  Clinical Impression  Limited bed level eval only. Pt unable to tolerate mobility on today. He could not tolerate any attempts at assessing ROM, strength of UEs/LEs-moaned out in pain with each attempt. Pt requested something for pain-made RN aware. He also requested cream for his back due to reported itching/burning-made RN aware. No family present during session. Will plan to follow and progress activity as pt is able to tolerate. Patient will benefit from continued inpatient follow up therapy, <3 hours/day       If plan is discharge home, recommend the following: Two people to help with walking and/or transfers;Two people to help with bathing/dressing/bathroom;Assistance with feeding;Assistance with cooking/housework;Assist for transportation;Help with stairs or ramp for entrance   Can travel by private vehicle   No    Equipment Recommendations  (TBD)  Recommendations for Other Services       Functional Status Assessment Patient has had a recent decline in their functional status and/or demonstrates limited ability to make significant improvements in function in a reasonable and predictable amount of time     Precautions / Restrictions Precautions Precautions: Fall Restrictions Weight Bearing Restrictions Per Provider Order: No      Mobility  Bed Mobility               General bed mobility comments: NT-pt unable to tolerate. He could not even tolerate any attempts at ranging UE/LE. He stated he neede something for his pain-made RN aware    Transfers                        Ambulation/Gait                  Stairs            Wheelchair  Mobility     Tilt Bed    Modified Rankin (Stroke Patients Only)       Balance                                             Pertinent Vitals/Pain Pain Assessment Pain Assessment: Faces Faces Pain Scale: Hurts whole lot Pain Location: pain with any attempts at moving UE/LEs; back itching/burning? Pain Descriptors / Indicators: Grimacing, Burning Pain Intervention(s): Limited activity within patient's tolerance    Home Living Family/patient expects to be discharged to:: Skilled nursing facility                 Home Equipment: Agricultural consultant (2 wheels);Wheelchair - manual      Prior Function               Mobility Comments: RW vs WC-limited by chronic hip pain       Extremity/Trunk Assessment   Upper Extremity Assessment Upper Extremity Assessment: Defer to OT evaluation (Attempted ROM of UEs-pt immediately moaned out in pain)    Lower Extremity Assessment Lower Extremity Assessment:  (Attempted ROM of LEs-pt immediately moaned out in pain)       Communication   Communication Communication: Impaired Factors Affecting Communication: Hearing impaired    Cognition Arousal: Lethargic Behavior  During Therapy: WFL for tasks assessed/performed   PT - Cognitive impairments: Difficult to assess Difficult to assess due to: Hard of hearing/deaf, Level of arousal                     PT - Cognition Comments: able to state birthdate (scorpio), current year, location         Cueing Cueing Techniques: Verbal cues, Tactile cues     General Comments      Exercises     Assessment/Plan    PT Assessment Patient needs continued PT services  PT Problem List Decreased strength;Decreased range of motion;Decreased activity tolerance;Decreased balance;Decreased mobility;Decreased knowledge of use of DME;Decreased skin integrity;Pain       PT Treatment Interventions DME instruction;Gait training;Therapeutic activities;Therapeutic  exercise;Patient/family education;Balance training    PT Goals (Current goals can be found in the Care Plan section)  Acute Rehab PT Goals Patient Stated Goal: none stated on today besides better pain control PT Goal Formulation: With patient Time For Goal Achievement: 12/23/23 Potential to Achieve Goals: Fair    Frequency Min 2X/week     Co-evaluation               AM-PAC PT 6 Clicks Mobility  Outcome Measure Help needed turning from your back to your side while in a flat bed without using bedrails?: Total Help needed moving from lying on your back to sitting on the side of a flat bed without using bedrails?: Total Help needed moving to and from a bed to a chair (including a wheelchair)?: Total Help needed standing up from a chair using your arms (e.g., wheelchair or bedside chair)?: Total Help needed to walk in hospital room?: Total Help needed climbing 3-5 steps with a railing? : Total 6 Click Score: 6    End of Session   Activity Tolerance: Patient limited by pain Patient left: in bed;with call bell/phone within reach;with bed alarm set   PT Visit Diagnosis: Muscle weakness (generalized) (M62.81);Difficulty in walking, not elsewhere classified (R26.2)    Time: 8544-8496 PT Time Calculation (min) (ACUTE ONLY): 8 min   Charges:   PT Evaluation $PT Eval Low Complexity: 1 Low   PT General Charges $$ ACUTE PT VISIT: 1 Visit           Dannial SQUIBB, PT Acute Rehabilitation  Office: [phone]

## 2023-12-09 DEATH — deceased

## 2023-12-10 ENCOUNTER — Encounter (HOSPITAL_COMMUNITY): Payer: Self-pay | Admitting: Family Medicine

## 2023-12-10 ENCOUNTER — Inpatient Hospital Stay (HOSPITAL_COMMUNITY)

## 2023-12-10 DIAGNOSIS — I5033 Acute on chronic diastolic (congestive) heart failure: Secondary | ICD-10-CM | POA: Diagnosis not present

## 2023-12-10 LAB — BASIC METABOLIC PANEL WITH GFR
Anion gap: 13 (ref 5–15)
BUN: 30 mg/dL — ABNORMAL HIGH (ref 8–23)
CO2: 21 mmol/L — ABNORMAL LOW (ref 22–32)
Calcium: 9.2 mg/dL (ref 8.9–10.3)
Chloride: 99 mmol/L (ref 98–111)
Creatinine, Ser: 3.07 mg/dL — ABNORMAL HIGH (ref 0.61–1.24)
GFR, Estimated: 19 mL/min — ABNORMAL LOW (ref >60)
Glucose, Bld: 68 mg/dL — ABNORMAL LOW (ref 70–99)
Potassium: 5 mmol/L (ref 3.5–5.1)
Sodium: 133 mmol/L — ABNORMAL LOW (ref 135–145)

## 2023-12-10 LAB — CBC
HCT: 37 % — ABNORMAL LOW (ref 39.0–52.0)
Hemoglobin: 10.9 g/dL — ABNORMAL LOW (ref 13.0–17.0)
MCH: 28.5 pg (ref 26.0–34.0)
MCHC: 29.5 g/dL — ABNORMAL LOW (ref 30.0–36.0)
MCV: 96.6 fL (ref 80.0–100.0)
Platelets: 249 K/uL (ref 150–400)
RBC: 3.83 MIL/uL — ABNORMAL LOW (ref 4.22–5.81)
RDW: 14.5 % (ref 11.5–15.5)
WBC: 7 K/uL (ref 4.0–10.5)
nRBC: 0 % (ref 0.0–0.2)

## 2023-12-10 LAB — SEDIMENTATION RATE: Sed Rate: 9 mm/h (ref 0–16)

## 2023-12-10 LAB — PRO BRAIN NATRIURETIC PEPTIDE: Pro Brain Natriuretic Peptide: >35000 pg/mL — ABNORMAL HIGH (ref <300.0)

## 2023-12-10 LAB — GLUCOSE, CAPILLARY
Glucose-Capillary: 63 mg/dL — ABNORMAL LOW (ref 70–99)
Glucose-Capillary: 88 mg/dL (ref 70–99)
Glucose-Capillary: 85 mg/dL (ref 70–99)
Glucose-Capillary: 59 mg/dL — ABNORMAL LOW (ref 70–99)
Glucose-Capillary: 70 mg/dL (ref 70–99)
Glucose-Capillary: 76 mg/dL (ref 70–99)

## 2023-12-10 LAB — C-REACTIVE PROTEIN: CRP: 7.8 mg/dL — ABNORMAL HIGH (ref <1.0)

## 2023-12-10 MED ORDER — HYDROMORPHONE HCL 1 MG/ML IJ SOLN
0.5000 mg | INTRAMUSCULAR | Status: DC | PRN
  Administered 2023-12-10 – 2023-12-11 (×2): 1 mg via INTRAVENOUS
  Filled 2023-12-10 (×2): qty 1

## 2023-12-10 MED ORDER — DEXTROSE-SODIUM CHLORIDE 5-0.9 % IV SOLN: INTRAVENOUS | Status: DC

## 2023-12-10 MED ORDER — GLUCAGON HCL RDNA (DIAGNOSTIC) 1 MG IJ SOLR
1.0000 mg | Freq: Once | INTRAMUSCULAR | Status: AC | PRN
  Administered 2023-12-10: 1 mg via INTRAVENOUS
  Filled 2023-12-10: qty 1

## 2023-12-10 MED ORDER — HYDROMORPHONE HCL 1 MG/ML IJ SOLN: 0.5000 mg | INTRAMUSCULAR | Status: DC | PRN

## 2023-12-10 NOTE — Consult Note (Signed)
 Renal Service Consult Note [facility] Lamar JONETTA Fret, MD  Patient: Gabriel Ayala Date: 12/10/2023 Requesting Physician: Dr. Dino  Reason for Consult: Renal failure HPI: The patient is a 88 y.o. year-old w/ PMH as below who presented to ED 12/07/2023 complaining of bodyaches for 3 days, nausea and vomiting.  No shortness of breath or chest pain.  In ED blood pressure 150/72, HR 55, RR 20, 98% on room air, temp 98.1.  Later in the ED O2 sats dropped below to the low 90s with mild tachypnea and he was placed on 2 L nasal cannula oxygen.  Chest x-ray showed bilateral pleural effusions without pulmonary edema, similar to prior.  CT abdomen demonstrated rectal wall thickening with surrounding inflammatory changes.  Creatinine was 0.9, hemoglobin 10, proBNP 10,592.  Patient was started on Lasix  IV also given fentanyl, morphine , and Zofran  and Foley catheter was placed.  The patient did receive 100 cc IV contrast on the day of admission with a CAT scan.  Creatinine was up slightly the next day to 1.0, then 1.8 yesterday and 3.0 today.  Potassium was 5.0.  We are asked to see for renal failure.  Pt seen in room.  He is moaning and complaining of pain all over.  Denies any significant shortness of breath.  He is lying flat.    Patient had 20 mg IV Lasix  on 9/29, 40 mg IV x 2 on 9/30, total 120 mg IV on 10/01 and none today.  He has not received any blood pressure lowering medications here. No nsaids, acei or ARB. Did get IV contrast.  He has received a lot of IV fentanyl p.o. oxycodone and some antiemetics.  His blood pressure on presentation was 140/75, it has slowly declined and today BP is 105/45.    ROS - denies CP, no joint pain, no HA, no blurry vision, no rash, no diarrhea, no nausea/ vomiting   Past Medical History  Past Medical History:  Diagnosis Date   A-fib (HCC)    CHF (congestive heart failure) (HCC)    Neuropathy    Past Surgical History  Past Surgical History:   Procedure Laterality Date   APPENDECTOMY     BACK SURGERY     EYE SURGERY     detached retina   NASAL RECONSTRUCTION     PROSTATE SURGERY     Family History History reviewed. No pertinent family history. Social History  reports that he has never smoked. He has never used smokeless tobacco. He reports current alcohol use of about 2.0 standard drinks of alcohol per week. He reports that he does not use drugs. Allergies  Allergies  Allergen Reactions   Amoxicillin-Pot Clavulanate Other (See Comments)    Unknown    Duloxetine Hcl Other (See Comments)    Unknown    Pregabalin Other (See Comments)    Unknown    Gluten Meal Other (See Comments)    Unknown    Oxycodone Other (See Comments)    Unknown    Home medications Prior to Admission medications   Medication Sig Start Date End Date Taking? Authorizing Provider  acetaminophen  (TYLENOL ) 500 MG tablet Take 1,000 mg by mouth in the morning, at noon, and at bedtime.   Yes [provider]  alum & mag hydroxide-simeth (MAALOX PLUS) 400-400-40 MG/5ML suspension Take 30 mLs by mouth every 4 (four) hours as needed for indigestion.   Yes [provider]  docusate sodium (COLACE) 100 MG capsule Take 100 mg by mouth  daily.   Yes [provider]  dupilumab  (DUPIXENT ) 300 MG/2ML prefilled syringe Inject 300 mg into the skin every 14 (fourteen) days.   Yes [provider]  furosemide  (LASIX ) 80 MG tablet Take by mouth.   Yes [provider]  gabapentin  (NEURONTIN ) 100 MG capsule Take 100 mg by mouth 3 (three) times daily.   Yes [provider]  hydrOXYzine (ATARAX) 25 MG tablet Take 25 mg by mouth at bedtime. 11/17/23  Yes [provider]  Multiple Vitamins-Minerals (ICAPS AREDS 2 PO) Take 1 tablet by mouth daily.   Yes [provider]  Olopatadine HCl (PATADAY OP) Place 1 drop into both eyes daily.   Yes [provider]  omeprazole  (PRILOSEC) 20 MG capsule Take 1  capsule (20 mg total) by mouth daily. 20 MG CAPSULE: Give 1 capsule by mouth daily Patient taking differently: Take 20 mg by mouth daily. 05/01/20  Yes Fargo, Amy E, NP  sertraline  (ZOLOFT ) 50 MG tablet Take 50 mg by mouth daily.   Yes [provider]  triamcinolone  cream (KENALOG ) 0.1 % Apply 1 Application topically daily.   Yes [provider]  Nutritional Supplements (ENSURE PO) Take 1 Container by mouth in the morning and at bedtime. (prefers Vanilla) d/t decreased intake. Patient not taking: Reported on 12/08/2023 03/05/20   [provider]  sucralfate (CARAFATE) 1 GM/10ML suspension Take 1 g by mouth. Patient not taking: Reported on 12/08/2023 01/17/21 12/17/23  [provider]     Vitals:   12/09/23 2049 12/10/23 0500 12/10/23 0909 12/10/23 1311  BP:  (!) 105/50 (!) 101/48 (!) 101/46  Pulse:  (!) 59 69 70  Resp: 20 16  14   Temp: 98 F (36.7 C) 97.8 F (36.6 C)  98 F (36.7 C)  TempSrc: Oral Oral    SpO2: 98% 95%  96%  Weight:  65.5 kg    Height:       Exam Gen elderly, frail appearing man, appears to be in pain Sclera anicteric, throat clear  No jvd or bruits Chest mostly clear, occasional basilar rales RRR no MRG Abd soft ntnd no mass or ascites +bs Ext trace ankle edema, no other edema Neuro is alert, deconditioned, Ox 3   Home bp meds: Lasix  80mg   No others    Date   Creat  eGFR (ml/min) 2020- nov 2023 1.10- 1.20 55- 58 ml/min 9/29   0.92  > 60 ml/min, CT abd + contrast  9/30   1.05  > 60   10/01   1.84  35 10/02   3.07  19 ml/min    UA 9/29 - mod LE, 30 prot, 0-5 rbc, > 50 wbc's Renal US  - pend CXR 9/29 - L > R pleural effusions, no pulm edema, no change from prior      Assessment/ Plan: AKI on CKD 3a: b/l creatinine 1.1- 1.2 from 2020- nov 2023, eGFR 55- 58 ml/min. Creat here was 0.9 on admission, now up to 3.0 three days later. Pt did receive IV contrast w/ CT scan on 9/29. BP's wnl on admit 140/ 65, then slowly  dropping to 105/ 46 today. UA showed wbc's, no hematuria, min proteinuria. Renal US  pending. AKI likely due to contrast +/- diuresis +/- borderline hypotension. Recommend supportive care. Could try gentle IVFs (50- 65 cc/hr NS or LR) if creatinine is not improving. Pt is not a good candidate for RRT, have d/w pmd. Will follow.  Chronic HFpEF: bilat effusions on CXR. Has had  repeated admission for decomp CHF in the last few yrs. Has valvular disease as well as diastolic dysfunction. Lasix  on hold for ^creatinine. Agree.  H/o PAF: s/p Watchman Debility       Rob Geralynn  MD [facility] 12/10/2023, 1:12 PM  Recent Labs  Lab 12/09/23 0456 12/10/23 0456  CREATININE 1.84* 3.07*  K 4.9 5.0   Inpatient medications:  Chlorhexidine Gluconate Cloth  6 each Topical Daily   collagenase   Topical Daily   enoxaparin (LOVENOX) injection  30 mg Subcutaneous QHS   gabapentin   100 mg Oral TID   Influenza vac split trivalent PF  0.5 mL Intramuscular Tomorrow-1000   pantoprazole   40 mg Oral BID   pneumococcal 20-valent conjugate vaccine  0.5 mL Intramuscular Tomorrow-1000   sertraline   50 mg Oral Daily   sodium chloride  flush  3 mL Intravenous Q12H    acetaminophen  **OR** acetaminophen , bisacodyl, fentaNYL (SUBLIMAZE) injection, oxyCODONE, polyethylene glycol, prochlorperazine

## 2023-12-10 NOTE — Progress Notes (Signed)
 Pt given 12.5 mcg of Fentanyl. This RN was unable to waste remainder of 50 mcg (37.5 mcg of Fentanyl) via pyxis. So 37.5 mcg were wasted with charge nurse Estela.

## 2023-12-10 NOTE — Consult Note (Signed)
 Cardiology Consultation   Patient ID: KOEHN SALEHI MRN: [id number]; DOB: May 03, 1935  Admit date: 12/07/2023 Date of Consult: 12/10/2023  PCP:  [name], MD   [facility] Providers Cardiologist:  None      Patient Profile: Gabriel Ayala is a 88 y.o. male with a hx of hypertension, HFpEF, persistent atrial fibrillation s/p Watchman, mitral regurgitation s/p MitraClip who is being seen 12/10/2023 for the evaluation of heart failure at the request of Deliliah Room MD.  History of Present Illness: Mr. Gabriel Ayala is an 88 year old male with prior cardiac history listed below.  Patient has previously been followed by Dr Rojelio at [facility] cardiology.  On 07/2021 the patient underwent a mitriaClip procedure.  The clip was used at the A2 P2.  The procedure was successful and the patient's MR improved from severe to mild.  On 10/2021 the patient had a Watchman procedure performed.  With TEE.  Patient presented to the emergency department for weakness, nausea, generalized aches, and constipation.  The patient was admitted to the hospitalist service and felt to be volume overloaded.  Because of this was started on IV Lasix .  Labs today showed the patient developed an AKI and the Lasix  was held.  On interview the patient was alert but slightly disorientated.   He knew what hospital he was at but thought the date was September 3rd. This limited the interview.  Reported that he came to the hospital because of weakness and generalized aching.  The patient reported that he had not walked for 6 months and that his weakness and other symptoms had been worse over the past few days.  Reported that his activity has been limited by back pain and hip pain.  He denied any chest pain, lower extremity edema, but did report that he has had chronic shortness of breath.  The patient also stated he has had dysphagia over the past 2 to 3 years.  Labs showed elevated proBNP of greater than 35,000,  AKI with a creatinine of 3.07, anemia with a hemoglobin of 10.9, normal leukocytes of 7.0, elevated CRP of 7.8, and potassium of 5.0.  Chest x-ray on 12/07/2023 showed Small to moderate left-greater-than-right pleural effusions with bibasilar opacities favored to reflect atelectasis, not significantly changed compared to the prior exam.  CT abdomen and pelvis showed  Rectal wall thickening with surrounding inflammation compatible with proctitis,  Colonic diverticulosis,  Bilateral pleural effusions, right greater than left, Cardiomegaly, Stable left lower quadrant ventral hernia containing nondilated colon, and Aortic atherosclerosis.  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  EKG showed atrial fibrillation with a rate of 59 and a left anterior fascicular block.  Echocardiogram showed an LVEF of 60 to 65%, no RWMA, mild LVH, normal RV function, severely dilated left atrium, moderately dilated right atrium, abnormal mitral valve with mild regurgitation RVSP of 53 mmHg, indicated a slightly elevated right atrial pressure of 8 mmHg, and mild aortic root dilation measuring 39 mm.  Past Medical History:  Diagnosis Date   A-fib (HCC)    CHF (congestive heart failure) (HCC)    Neuropathy     Past Surgical History:  Procedure Laterality Date   APPENDECTOMY     BACK SURGERY     EYE SURGERY     detached retina   NASAL RECONSTRUCTION     PROSTATE SURGERY       Home Medications:  Prior to Admission medications   Medication Sig Start Date End Date Taking? Authorizing Provider  acetaminophen  (TYLENOL ) 500 MG tablet Take 1,000 mg by mouth in the morning, at noon, and at bedtime.   Yes [provider]  alum & mag hydroxide-simeth (MAALOX PLUS) 400-400-40 MG/5ML suspension Take 30 mLs by mouth every 4 (four) hours as needed for indigestion.   Yes [provider]   docusate sodium (COLACE) 100 MG capsule Take 100 mg by mouth daily.   Yes [provider]  dupilumab  (DUPIXENT ) 300 MG/2ML prefilled syringe Inject 300 mg into the skin every 14 (fourteen) days.   Yes [provider]  furosemide  (LASIX ) 80 MG tablet Take by mouth.   Yes [provider]  gabapentin  (NEURONTIN ) 100 MG capsule Take 100 mg by mouth 3 (three) times daily.   Yes [provider]  hydrOXYzine (ATARAX) 25 MG tablet Take 25 mg by mouth at bedtime. 11/17/23  Yes [provider]  Multiple Vitamins-Minerals (ICAPS AREDS 2 PO) Take 1 tablet by mouth daily.   Yes [provider]  Olopatadine HCl (PATADAY OP) Place 1 drop into both eyes daily.   Yes [provider]  omeprazole  (PRILOSEC) 20 MG capsule Take 1 capsule (20 mg total) by mouth daily. 20 MG CAPSULE: Give 1 capsule by mouth daily Patient taking differently: Take 20 mg by mouth daily. 05/01/20  Yes Fargo, Amy E, NP  sertraline  (ZOLOFT ) 50 MG tablet Take 50 mg by mouth daily.   Yes [provider]  triamcinolone  cream (KENALOG ) 0.1 % Apply 1 Application topically daily.   Yes [provider]  Nutritional Supplements (ENSURE PO) Take 1 Container by mouth in the morning and at bedtime. (prefers Vanilla) d/t decreased intake. Patient not taking: Reported on 12/08/2023 03/05/20   [provider]  sucralfate (CARAFATE) 1 GM/10ML suspension Take 1 g by mouth. Patient not taking: Reported on 12/08/2023 01/17/21 12/17/23  [provider]    Scheduled Meds:  Chlorhexidine Gluconate Cloth  6 each Topical Daily   collagenase   Topical Daily   enoxaparin (LOVENOX) injection  30 mg Subcutaneous QHS   gabapentin   100 mg Oral TID   Influenza vac split trivalent PF  0.5 mL Intramuscular Tomorrow-1000   pantoprazole   40 mg Oral BID   pneumococcal 20-valent conjugate vaccine  0.5 mL Intramuscular Tomorrow-1000   sertraline   50 mg Oral Daily   sodium  chloride flush  3 mL Intravenous Q12H   Continuous Infusions:  PRN Meds: acetaminophen  **OR** acetaminophen , bisacodyl, fentaNYL (SUBLIMAZE) injection, oxyCODONE, polyethylene glycol, prochlorperazine  Allergies:    Allergies  Allergen Reactions   Amoxicillin-Pot Clavulanate Other (See Comments)    Unknown    Duloxetine Hcl Other (See Comments)    Unknown    Pregabalin Other (See Comments)    Unknown    Gluten Meal Other (See Comments)    Unknown    Oxycodone Other (See Comments)    Unknown     Social History:   Social History   Socioeconomic History   Marital status: Widowed    Spouse name: Not on file   Number of children: Not on file   Years of education: Not on file   Highest education level: Not on file  Occupational History   Not on file  Tobacco Use   Smoking status: Never   Smokeless tobacco: Never  Substance and Sexual Activity   Alcohol use: Yes    Alcohol/week: 2.0 standard drinks of alcohol  Types: 2 Glasses of wine per week    Comment: daily   Drug use: No   Sexual activity: Not Currently    Partners: Male  Other Topics Concern   Not on file  Social History Narrative   Not on file   Social Drivers of Health   Financial Resource Strain: Not on file  Food Insecurity: No Food Insecurity (12/08/2023)   Hunger Vital Sign    Worried About Running Out of Food in the Last Year: Never true    Ran Out of Food in the Last Year: Never true  Transportation Needs: No Transportation Needs (12/08/2023)   PRAPARE - Administrator, Civil Service (Medical): No    Lack of Transportation (Non-Medical): No  Physical Activity: Not on file  Stress: Not on file  Social Connections: Socially Isolated (12/08/2023)   Social Connection and Isolation Panel    Frequency of Communication with Friends and Family: Once a week    Frequency of Social Gatherings with Friends and Family: Once a week    Attends Religious Services: 1 to 4 times per year    Active  Member of [employer] or Organizations: No    Attends Banker Meetings: Never    Marital Status: Widowed  Intimate Partner Violence: Not At Risk (12/08/2023)   Humiliation, Afraid, Rape, and Kick questionnaire    Fear of Current or Ex-Partner: No    Emotionally Abused: No    Physically Abused: No    Sexually Abused: No    Family History:   History reviewed. No pertinent family history.   ROS:  Please see the history of present illness.   All other ROS reviewed and negative.     Physical Exam/Data: Vitals:   12/09/23 1316 12/09/23 2049 12/10/23 0500 12/10/23 0909  BP: (!) 114/50 (!) 117/54 (!) 105/50 (!) 101/48  Pulse: 64 74 (!) 59 69  Resp: 20 20 16    Temp: 98.2 F (36.8 C) 98 F (36.7 C) 97.8 F (36.6 C)   TempSrc: Oral Oral Oral   SpO2: 98% 98% 95%   Weight:   65.5 kg   Height:        Intake/Output Summary (Last 24 hours) at 12/10/2023 1045 Last data filed at 12/10/2023 0741 Gross per 24 hour  Intake 13 ml  Output 300 ml  Net -287 ml      12/10/2023    5:00 AM 12/09/2023    3:31 AM 12/08/2023    3:26 PM  Last 3 Weights  Weight (lbs) 144 lb 6.4 oz 146 lb 2.6 oz 146 lb 1.6 oz  Weight (kg) 65.5 kg 66.3 kg 66.271 kg     Body mass index is 20.14 kg/m.  General: Malnourished and cachectic.  Overall appearing fatigued.  Alert and orientated 3 out of 4.  On 2 L nasal cannula. HEENT: normal Neck: Positive JVD Vascular: No carotid bruits; Distal pulses 2+ bilaterally Cardiac: 2 out of 6 systolic murmur heard best at the apex. Lungs: Mild diffuse wheezing. Abd: Epigastric area was significantly tender to palpation. Ext: no edema Musculoskeletal:  No deformities Skin: warm and dry  Neuro:  no focal abnormalities noted Psych:  Normal affect   EKG:  The EKG was personally reviewed and demonstrates:   atrial fibrillation with a rate of 59 and a left anterior fascicular block. Telemetry:  Telemetry was personally reviewed and demonstrates: Atrial fibrillation with  heart rates in the 60s to 70s.  Relevant CV Studies: Echocardiogram this hospitalization  IMPRESSIONS     1. Left ventricular ejection fraction, by estimation, is 60 to 65%. The  left ventricle has normal function. The left ventricle has no regional  wall motion abnormalities. There is mild left ventricular hypertrophy.  Left ventricular diastolic function  could not be evaluated.   2. Right ventricular systolic function is low normal. The right  ventricular size is normal. There is moderately elevated pulmonary artery  systolic pressure. The estimated right ventricular systolic pressure is  49.5 mmHg.   3. Left atrial size was severely dilated.   4. Right atrial size was moderately dilated.   5. Large pleural effusion.   6. The mitral valve is abnormal. Mild mitral valve regurgitation.  Procedure Date: 07/17/2021.   7. The aortic valve is calcified. Aortic valve regurgitation is trivial.  Moderate aortic valve stenosis.   8. Aortic dilatation noted. There is mild dilatation of the aortic root,  measuring 39 mm.   9. The inferior vena cava is normal in size with <50% respiratory  variability, suggesting right atrial pressure of 8 mmHg.   Comparison(s): A prior study was performed on 07/28/2022. THe ejection  fraction was 60-65% with mild aortic stenosis, mild aortic regurgitation,  moderate mitral regurgitation and RVSP 53 mmHg. No other significant  changes.   Laboratory Data: High Sensitivity Troponin:  No results for input(s): TROPONINIHS in the last 720 hours.   Chemistry Recent Labs  Lab 12/08/23 0500 12/09/23 0456 12/10/23 0456  NA 129* 130* 133*  K 4.6 4.9 5.0  CL 98 98 99  CO2 21* 20* 21*  GLUCOSE 40* 90 68*  BUN 19 24* 30*  CREATININE 1.05 1.84* 3.07*  CALCIUM 8.8* 8.8* 9.2  GFRNONAA >60 35* 19*  ANIONGAP 10 12 13     Recent Labs  Lab 12/07/23 1320  PROT 6.2*  ALBUMIN 3.9  AST 12*  ALT 8  ALKPHOS 113  BILITOT 0.6   Lipids No results for  input(s): CHOL, TRIG, HDL, LABVLDL, LDLCALC, CHOLHDL in the last 168 hours.  Hematology Recent Labs  Lab 12/08/23 0500 12/09/23 0456 12/10/23 0456  WBC 7.1 7.3 7.0  RBC 3.63* 3.59* 3.83*  HGB 10.6* 10.1* 10.9*  HCT 34.0* 33.8* 37.0*  MCV 93.7 94.2 96.6  MCH 29.2 28.1 28.5  MCHC 31.2 29.9* 29.5*  RDW 14.3 14.4 14.5  PLT 287 260 249   Thyroid No results for input(s): TSH, FREET4 in the last 168 hours.  BNP Recent Labs  Lab 12/07/23 1320 12/10/23 0456  PROBNP 10,592.0* >35,000.0*    DDimer No results for input(s): DDIMER in the last 168 hours.  Radiology/Studies:  ECHOCARDIOGRAM COMPLETE Result Date: 12/08/2023    ECHOCARDIOGRAM REPORT   Patient Name:   Gabriel Ayala Date of Exam: 12/08/2023 Medical Rec #:  [id number]        Height:       72.0 in Accession #:    [id number]       Weight:       180.0 lb Date of Birth:  [birth year]        BSA:          2.037 m Patient Age:    87 years         BP:           133/50 mmHg Patient Gender: M                HR:           62 bpm. Exam Location:  Inpatient Procedure: 2D Echo, Cardiac Doppler and Color Doppler (Both Spectral and Color            Flow Doppler were utilized during procedure). Indications:    I50.40* Unspecified combined systolic (congestive) and diastolic                 (congestive) heart failure  History:        Patient has no prior history of Echocardiogram examinations.                 CHF, Abnormal ECG, Mitral Valve Disease, Arrythmias:Atrial                 Fibrillation; Risk Factors:Hypertension and Dyslipidemia.                 HefPef. Watchman device. Mitraclip procedure.  Sonographer:    Ellouise Mose RDCS Referring Phys: [id number] TIMOTHY S OPYD IMPRESSIONS  1. Left ventricular ejection fraction, by estimation, is 60 to 65%. The left ventricle has normal function. The left ventricle has no regional wall motion abnormalities. There is mild left ventricular hypertrophy. Left ventricular diastolic function could not be  evaluated.  2. Right ventricular systolic function is low normal. The right ventricular size is normal. There is moderately elevated pulmonary artery systolic pressure. The estimated right ventricular systolic pressure is 49.5 mmHg.  3. Left atrial size was severely dilated.  4. Right atrial size was moderately dilated.  5. Large pleural effusion.  6. The mitral valve is abnormal. Mild mitral valve regurgitation. Procedure Date: 07/17/2021.  7. The aortic valve is calcified. Aortic valve regurgitation is trivial. Moderate aortic valve stenosis.  8. Aortic dilatation noted. There is mild dilatation of the aortic root, measuring 39 mm.  9. The inferior vena cava is normal in size with <50% respiratory variability, suggesting right atrial pressure of 8 mmHg. Comparison(s): A prior study was performed on 07/28/2022. THe ejection fraction was 60-65% with mild aortic stenosis, mild aortic regurgitation, moderate mitral regurgitation and RVSP 53 mmHg. No other significant changes. FINDINGS  Left Ventricle: Left ventricular ejection fraction, by estimation, is 60 to 65%. The left ventricle has normal function. The left ventricle has no regional wall motion abnormalities. The left ventricular internal cavity size was normal in size. There is  mild left ventricular hypertrophy. Left ventricular diastolic function could not be evaluated. Right Ventricle: The right ventricular size is normal. No increase in right ventricular wall thickness. Right ventricular systolic function is low normal. There is moderately elevated pulmonary artery systolic pressure. The tricuspid regurgitant velocity  is 3.22 m/s, and with an assumed right atrial pressure of 8 mmHg, the estimated right ventricular systolic pressure is 49.5 mmHg. Left Atrium: Left atrial size was severely dilated. Right Atrium: Right atrial size was moderately dilated. Pericardium: There is no evidence of pericardial effusion. Mitral Valve: The mitral valve is abnormal.  Mild mitral valve regurgitation. There is a Mitra-Clip present in the mitral position. MV peak gradient, 11.8 mmHg. The mean mitral valve gradient is 4.5 mmHg. Tricuspid Valve: The tricuspid valve is normal in structure. Tricuspid valve regurgitation is mild. Aortic Valve: The aortic valve is calcified. Aortic valve regurgitation is trivial. Moderate aortic stenosis is present. Aortic valve mean gradient measures 19.0 mmHg. Aortic valve peak gradient measures 36.9 mmHg. Aortic valve area, by VTI measures 1.11  cm. Pulmonic Valve: The pulmonic valve was normal in structure. Pulmonic valve regurgitation is mild. Aorta: Aortic dilatation noted. There is mild dilatation of the aortic root, measuring  39 mm. Venous: The inferior vena cava is normal in size with less than 50% respiratory variability, suggesting right atrial pressure of 8 mmHg. IAS/Shunts: The interatrial septum was not assessed. Additional Comments: There is a large pleural effusion.  LEFT VENTRICLE PLAX 2D LVIDd:         4.80 cm LVIDs:         3.00 cm LV PW:         1.10 cm LV IVS:        1.30 cm LVOT diam:     2.40 cm LV SV:         60 LV SV Index:   30 LVOT Area:     4.52 cm  LV Volumes (MOD) LV vol d, MOD A2C: 129.0 ml LV vol d, MOD A4C: 62.4 ml LV vol s, MOD A2C: 50.6 ml LV vol s, MOD A4C: 20.5 ml LV SV MOD A2C:     78.4 ml LV SV MOD A4C:     62.4 ml LV SV MOD BP:      60.0 ml RIGHT VENTRICLE            IVC RV S prime:     9.46 cm/s  IVC diam: 2.70 cm TAPSE (M-mode): 1.5 cm LEFT ATRIUM              Index        RIGHT ATRIUM           Index LA diam:        4.40 cm  2.16 cm/m   RA Area:     23.80 cm LA Vol (A2C):   96.7 ml  47.47 ml/m  RA Volume:   59.80 ml  29.35 ml/m LA Vol (A4C):   116.0 ml 56.94 ml/m LA Biplane Vol: 117.0 ml 57.43 ml/m  AORTIC VALVE                     PULMONIC VALVE AV Area (Vmax):    1.31 cm      PR End Diast Vel: 2.16 msec AV Area (Vmean):   1.30 cm AV Area (VTI):     1.11 cm AV Vmax:           303.60 cm/s AV Vmean:           196.400 cm/s AV VTI:            0.544 m AV Peak Grad:      36.9 mmHg AV Mean Grad:      19.0 mmHg LVOT Vmax:         87.95 cm/s LVOT Vmean:        56.600 cm/s LVOT VTI:          0.134 m LVOT/AV VTI ratio: 0.25  AORTA Ao Root diam: 3.90 cm Ao Asc diam:  3.60 cm MITRAL VALVE                TRICUSPID VALVE MV Area (PHT): 2.99 cm     TR Peak grad:   41.5 mmHg MV Area VTI:   1.53 cm     TR Vmax:        322.00 cm/s MV Peak grad:  11.8 mmHg MV Mean grad:  4.5 mmHg     SHUNTS MV Vmax:       1.72 m/s     Systemic VTI:  0.13 m MV Vmean:      90.2 cm/s    Systemic Diam: 2.40 cm MV Decel  Time: 254 msec MV E velocity: 159.00 cm/s Emeline Calender Electronically signed by Emeline Calender Signature Date/Time: 12/08/2023/5:06:32 PM    Final    DG ESOPHAGUS W SINGLE CM (SOL OR THIN BA) Result Date: 12/08/2023 CLINICAL DATA:  Provided history: Dysphagia. EXAM: ESOPHAGUS/BARIUM SWALLOW TECHNIQUE: A single contrast esophagram was performed using thin liquid barium. The exam was performed by Delon Beagle NP and was supervised and interpreted by Dr. Rockey Childs. FLUOROSCOPY: Radiation Exposure Index (as provided by the fluoroscopic device): 4.30 mGy Kerma COMPARISON:  None. FINDINGS: Markedly limited examination due to limited patient cooperation (with the patient only swallowing small volumes of contrast). The patient did not swallow sufficient volumes of contrast to adequately assess for an esophageal stricture. Additionally, the patient refused to swallow a tablet. Small collection of contrast along the left anterolateral aspect of the cervical esophagus most consistent with a Killian-Jamieson diverticulum. Within described limitations, no significant esophageal dysmotility or gastroesophageal reflux observed. No appreciable hiatal hernia. IMPRESSION: 1. Markedly limited examination due to limited patient cooperation, as described. The patient did not swallow sufficient volumes of contrast to adequately assess for an esophageal  stricture. Additionally, the patient refused to swallow a barium tablet. 2. 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 Electronically Signed   By: Rockey Childs D.O.   On: 12/08/2023 12:54   CT ABDOMEN PELVIS W CONTRAST Result Date: 12/07/2023 CLINICAL DATA:  Acute abdominal pain EXAM: CT ABDOMEN AND PELVIS WITH CONTRAST TECHNIQUE: Multidetector CT imaging of the abdomen and pelvis was performed using the standard protocol following bolus administration of intravenous contrast. RADIATION DOSE REDUCTION: This exam was performed according to the departmental dose-optimization program which includes automated exposure control, adjustment of the mA and/or kV according to patient size and/or use of iterative reconstruction technique. CONTRAST:  100mL OMNIPAQUE IOHEXOL 300 MG/ML  SOLN COMPARISON:  CT abdomen and pelvis 03/11/2020. FINDINGS: Lower chest: Small left and moderate right pleural effusions are present with bibasilar atelectasis. The heart is enlarged. Hepatobiliary: No focal liver abnormality is seen. No gallstones, gallbladder wall thickening, or biliary dilatation. Pancreas: Unremarkable. No pancreatic ductal dilatation or surrounding inflammatory changes. Spleen: Normal in size without focal abnormality. Adrenals/Urinary Tract: The bilateral adrenal glands are within normal limits. There scattered rounded hypodensities in both kidneys which are too small to characterize there is a 2.3 cm cyst in the right kidney. There are additional mildly hyperdense rounded areas which are too small to characterize which may represent complex cysts. There is mild bilateral renal atrophy. There is no hydronephrosis. The bladder is within normal limits. Stomach/Bowel: There is rectal wall thickening with surrounding inflammation. There is no evidence for pneumatosis, free air or bowel obstruction. Scattered colonic diverticula are present. The appendix is not definitely visualized. Stomach is  within normal limits. Vascular/Lymphatic: Aortic atherosclerosis. No enlarged abdominal or pelvic lymph nodes. Reproductive: Prostate is unremarkable. Other: There is bulging of the anterior abdominal wall. There is a anterior left lower quadrant ventral hernia containing nondilated colon which appears similar to prior study. There is no free fluid or free air. Musculoskeletal: Degenerative changes affect the spine. There is chronic compression deformity of T12. Right hip arthroplasty is present. IMPRESSION: 1. Rectal wall thickening with surrounding inflammation compatible with proctitis. 2. Colonic diverticulosis. 3. Bilateral pleural effusions, right greater than left. 4. Cardiomegaly. 5. Stable left lower quadrant ventral hernia containing nondilated colon. 6. Aortic atherosclerosis. Aortic Atherosclerosis (ICD10-I70.0). Electronically Signed   By: Greig Pique M.D.   On: 12/07/2023 15:11  DG CHEST PORT 1 VIEW Result Date: 12/07/2023 CLINICAL DATA:  Body aches.  Nausea. EXAM: PORTABLE CHEST 1 VIEW COMPARISON:  08/01/2022. FINDINGS: The heart size and mediastinal contours are unchanged. Left atrial appendage occlusion device is again noted. Aortic atherosclerosis. Small to moderate left-greater-than-right pleural effusions with bibasilar opacities favored to reflect atelectasis, not significantly changed compared to the prior exam. No pneumothorax. No acute osseous abnormality. IMPRESSION: Small to moderate left-greater-than-right pleural effusions with bibasilar opacities favored to reflect atelectasis, not significantly changed compared to the prior exam. Electronically Signed   By: Harrietta Sherry M.D.   On: 12/07/2023 13:40     Assessment and Plan:  Gabriel Ayala is a 88 y.o. male with a hx of hypertension, HFpEF, persistent atrial fibrillation s/p Watchman, moderate to severe MR s/p MitraClip who is being seen 12/10/2023 for the evaluation of heart failure at the request of Deliliah Room  MD.  HFpEF Mitral regurgitation s/p MitraClip in 2023 Presented to the emergency department on 12/07/2023 for generalized weakness and body aches Had an elevated proBNP of 35,000 Chest x-ray showed Small to moderate left-greater-than-right pleural effusions with bibasilar opacities favored to reflect atelectasis, not significantly changed compared to the prior exam. Echocardiogram showed a normal LVEF of 60 to 65%, no RWMA, mild LVH, normal RV function, severely dilated left atrium, moderately dilated right atrium, abnormal mitral valve with mild regurgitation RVSP of 53 mmHg, indicated a slightly elevated right atrial pressure of 8 mmHg, and mild aortic root dilation measuring 39 mm. Patient was started on IV diuresis but developed an AKI and these were held Order Chest x-ray Continue to hold Lasix . May consider gentle IV fluids. Follow-up with [facility]ology at [facility]   Persistent atrial fibrillation s/p Watchman CHA2DS2-VASc Score = 5 [CHF History: 1, HTN History: 1, Diabetes History: 0, Stroke History: 0, Vascular Disease History: 1, Age Score: 2, Gender Score: 0].  Therefore, the patient's annual risk of stroke is 7.2 %.    In rate controlled atrial fibrillation on telemetry.   Otherwise management per primary   Risk Assessment/Risk Scores:     New [place]  Heart Association (NYHA) Functional Class NYHA Class IV  CHA2DS2-VASc Score = 5  This indicates a 7.2% annual risk of stroke. The patient's score is based upon: CHF History: 1 HTN History: 1 Diabetes History: 0 Stroke History: 0 Vascular Disease History: 1 Age Score: 2 Gender Score: 0     [facility] will sign off.   The patient is ready for discharge today from a cardiac standpoint. Medication Recommendations: As above Other recommendations (labs, testing, etc): As above Follow up as an outpatient: Follow-up with [facility].  For questions or updates, please contact [facility] Please consult  [email or web address] for contact info under     Signed, Morse Clause, PA-C  12/10/2023 10:45 AM

## 2023-12-10 NOTE — Progress Notes (Addendum)
 PROGRESS NOTE    Gabriel Ayala  FMW:[id number] DOB: 12-Jul-1935 DOA: 12/07/2023 PCP: [name], MD   Brief Narrative:   88 y.o. male past medical history significant for neuropathy, chronic HFpEF, atrial fibrillation status post Watchman procedure, severe mitral regurgitation status post MitraClip comes in complaining of generalized weakness, nausea vomiting difficulty voiding that started 3 days prior to admission, in the ED was found to have satting in the 90s, sodium of 129 unremarkable with blood cell count, hemoglobin of 10 proBNP greater than 2000, CT abdomen pelvis showed rectal wall thickening with rectal proctitis bilateral pleural effusion.  He has worsening AKI with minimal urine output so nephrology was consulted on 12/10/23. Poor prognosis and palliative team has been consulted.  Assessment & Plan:  Principal Problem:   Acute on chronic heart failure with preserved ejection fraction (HFpEF) (HCC) Active Problems:   Essential hypertension   Paroxysmal atrial fibrillation (HCC)   Acute urinary retention   Proctitis   Hyponatremia   Pleural effusion, bilateral   Chronic wound   Acute on chronic heart failure with preserved ejection fraction (HFpEF)/bilateral pleural effusion,POA: NYHA III Worsening AKI: likely a combination of contrast (CT abdomen/pelvis on 9/29), IV diuretics and poor oral intake. Chest x-ray showed bilateral pleural effusions with bibasilar opacities with elevated BNP. Positive JVD crackles at bases bilaterally. Patient was started on IV furosemide  every 12 hours, dced now.   Continue strict ins and outs and daily weights. Echocardiogram shows normal LVEF.  He was given 80 mg of IV lasix  on 10/1 around 1.30 pm and UOP is 300 ml since then. No more diuretics. Consulted nephrology. Imaging of the kidneys showed mild b/l renal atrophy and cysts but no hydronephrosis.   Acute respiratory failure with hypoxia Noted to be on 4 to 5 L of oxygen by  nasal cannula.  Likely due to volume overload and pleural effusions.  Continue to monitor.   Dysphagia: He does have a history of esophageal stricture for which he had an EGD in 08/05/2022 with dilation.  Looks like this was done in [facility].  According to the report there was slight narrowing at the GE junction and in the upper esophagus just distal to the upper esophageal sphincter muscle.  A dilator was passed which went through easily without any resistance. Speech therapy on board. Recommendation is NPO.   Hypervolemic hyponatremia: Improved Close monitoring   Generalized weakness: PT and OT.   Paroxysmal atrial fibrillation: Status post Watchman device, not on anticoagulation.   Chronic leg wounds: Continue wound care.     Chronic right hip pain: Patient and healthcare power of attorney tell me that he has been having right hip pain for over 5 years.   Questionable proctitis: Appreciated on imaging, no diarrhea afebrile with no leukocytosis follow-up PCP as an outpatient.   Acute urinary retention: Foley placed in the ED, continue Foley strict I's and O's and daily weights    Goals of care Discussed with patient's healthcare power of attorney who is his caregiver as well.  Patient has 2 children one of them is in [place] and the other is in [place] .  Power of attorney mentioned that patient has been doing fairly well and has been independent up until a few weeks ago.  He has chronic hip pain as a result of which his ambulatory status is poor but he still is able to walk at times.  He bathes himself. So at this time we will proceed with treatment as  outlined above and see if he progresses.  If he does not progress then at that time palliative care can be consulted but will hold off for now. He is already a DNR.  Palliative team consulted.    DVT prophylaxis: enoxaparin (LOVENOX) injection 30 mg Start: 12/09/23 2200     Code Status: Limited: Do not attempt  resuscitation (DNR) -DNR-LIMITED -Do Not Intubate/DNI  Family Communication:  None at the bedside Status is: Inpatient Remains inpatient appropriate because: encephalopathy, worsening AKI    Subjective:  He appears tired and lethargic. He is unable to communicate effectively.   Examination:  General exam: Appears drowsy and tired, nasal oxygen cannula in place Respiratory system: Clear to auscultation. Respiratory effort normal. Cardiovascular system: S1 & S2 heard, RRR. No JVD, murmurs, rubs, gallops or clicks. No pedal edema. Gastrointestinal system: Abdomen is nondistended, soft and nontender. No organomegaly or masses felt. Normal bowel sounds heard. Central nervous system: drowsy and disoriented. No focal neurological deficits. Extremities: unable to assess strength due to altered mentation Skin: dry scaly skin over b/l LE with some edema    Wound 12/08/23 1614 Pressure Injury Buttocks Right Stage 3 -  Full thickness tissue loss. Subcutaneous fat may be visible but bone, tendon or muscle are NOT exposed. (Active)     Diet Orders (From admission, onward)     Start     Ordered   12/09/23 1244  Diet NPO time specified Except for: Sips with Meds, Ice Chips  Diet effective now       Comments: Necessary meds crushed in puree  Question Answer Comment  Except for Sips with Meds   Except for Ice Chips      12/09/23 1244            Objective: Vitals:   12/09/23 1316 12/09/23 2049 12/10/23 0500 12/10/23 0909  BP: (!) 114/50 (!) 117/54 (!) 105/50 (!) 101/48  Pulse: 64 74 (!) 59 69  Resp: 20 20 16    Temp: 98.2 F (36.8 C) 98 F (36.7 C) 97.8 F (36.6 C)   TempSrc: Oral Oral Oral   SpO2: 98% 98% 95%   Weight:   65.5 kg   Height:        Intake/Output Summary (Last 24 hours) at 12/10/2023 0934 Last data filed at 12/10/2023 0741 Gross per 24 hour  Intake 13 ml  Output 300 ml  Net -287 ml   Filed Weights   12/08/23 1526 12/09/23 0331 12/10/23 0500  Weight: 66.3  kg 66.3 kg 65.5 kg    Scheduled Meds:  Chlorhexidine Gluconate Cloth  6 each Topical Daily   collagenase   Topical Daily   enoxaparin (LOVENOX) injection  30 mg Subcutaneous QHS   gabapentin   100 mg Oral TID   Influenza vac split trivalent PF  0.5 mL Intramuscular Tomorrow-1000   pantoprazole   40 mg Oral BID   pneumococcal 20-valent conjugate vaccine  0.5 mL Intramuscular Tomorrow-1000   polyethylene glycol  17 g Oral BID   sertraline   50 mg Oral Daily   sodium chloride  flush  3 mL Intravenous Q12H   Continuous Infusions:  Nutritional status     Body mass index is 20.14 kg/m.  Data Reviewed:   CBC: Recent Labs  Lab 12/07/23 1320 12/08/23 0500 12/09/23 0456 12/10/23 0456  WBC 6.2 7.1 7.3 7.0  NEUTROABS 4.6  --   --   --   HGB 10.3* 10.6* 10.1* 10.9*  HCT 33.5* 34.0* 33.8* 37.0*  MCV 91.8 93.7 94.2  96.6  PLT 299 287 260 249   Basic Metabolic Panel: Recent Labs  Lab 12/07/23 1320 12/08/23 0500 12/09/23 0456 12/10/23 0456  NA 129* 129* 130* 133*  K 4.5 4.6 4.9 5.0  CL 97* 98 98 99  CO2 22 21* 20* 21*  GLUCOSE 81 40* 90 68*  BUN 17 19 24* 30*  CREATININE 0.92 1.05 1.84* 3.07*  CALCIUM 9.4 8.8* 8.8* 9.2   GFR: Estimated Creatinine Clearance: 16.3 mL/min (A) (by C-G formula based on SCr of 3.07 mg/dL (H)). Liver Function Tests: Recent Labs  Lab 12/07/23 1320  AST 12*  ALT 8  ALKPHOS 113  BILITOT 0.6  PROT 6.2*  ALBUMIN 3.9   Recent Labs  Lab 12/07/23 1320  LIPASE 11   No results for input(s): AMMONIA in the last 168 hours. Coagulation Profile: No results for input(s): INR, PROTIME in the last 168 hours. Cardiac Enzymes: No results for input(s): CKTOTAL, CKMB, CKMBINDEX, TROPONINI in the last 168 hours. BNP (last 3 results) Recent Labs    12/07/23 1320 12/10/23 0456  PROBNP 10,592.0* >35,000.0*   HbA1C: No results for input(s): HGBA1C in the last 72 hours. CBG: Recent Labs  Lab 12/08/23 0733 12/08/23 1436  12/09/23 1712 12/10/23 0715 12/10/23 0854  GLUCAP 116* 75 88 63* 88   Lipid Profile: No results for input(s): CHOL, HDL, LDLCALC, TRIG, CHOLHDL, LDLDIRECT in the last 72 hours. Thyroid Function Tests: No results for input(s): TSH, T4TOTAL, FREET4, T3FREE, THYROIDAB in the last 72 hours. Anemia Panel: No results for input(s): VITAMINB12, FOLATE, FERRITIN, TIBC, IRON, RETICCTPCT in the last 72 hours. Sepsis Labs: No results for input(s): PROCALCITON, LATICACIDVEN in the last 168 hours.  No results found for this or any previous visit (from the past 240 hours).       Radiology Studies: ECHOCARDIOGRAM COMPLETE Result Date: 12/08/2023    ECHOCARDIOGRAM REPORT   Patient Name:   LOTHAR PREHN Date of Exam: 12/08/2023 Medical Rec #:  [id number]        Height:       72.0 in Accession #:    [id number]       Weight:       180.0 lb Date of Birth:  [birth year]        BSA:          2.037 m Patient Age:    87 years         BP:           133/50 mmHg Patient Gender: M                HR:           62 bpm. Exam Location:  Inpatient Procedure: 2D Echo, Cardiac Doppler and Color Doppler (Both Spectral and Color            Flow Doppler were utilized during procedure). Indications:    I50.40* Unspecified combined systolic (congestive) and diastolic                 (congestive) heart failure  History:        Patient has no prior history of Echocardiogram examinations.                 CHF, Abnormal ECG, Mitral Valve Disease, Arrythmias:Atrial                 Fibrillation; Risk Factors:Hypertension and Dyslipidemia.                 HefPef.  Watchman device. Mitraclip procedure.  Sonographer:    Ellouise Mose RDCS Referring Phys: [id number] TIMOTHY S OPYD IMPRESSIONS  1. Left ventricular ejection fraction, by estimation, is 60 to 65%. The left ventricle has normal function. The left ventricle has no regional wall motion abnormalities. There is mild left ventricular hypertrophy. Left  ventricular diastolic function could not be evaluated.  2. Right ventricular systolic function is low normal. The right ventricular size is normal. There is moderately elevated pulmonary artery systolic pressure. The estimated right ventricular systolic pressure is 49.5 mmHg.  3. Left atrial size was severely dilated.  4. Right atrial size was moderately dilated.  5. Large pleural effusion.  6. The mitral valve is abnormal. Mild mitral valve regurgitation. Procedure Date: 07/17/2021.  7. The aortic valve is calcified. Aortic valve regurgitation is trivial. Moderate aortic valve stenosis.  8. Aortic dilatation noted. There is mild dilatation of the aortic root, measuring 39 mm.  9. The inferior vena cava is normal in size with <50% respiratory variability, suggesting right atrial pressure of 8 mmHg. Comparison(s): A prior study was performed on 07/28/2022. THe ejection fraction was 60-65% with mild aortic stenosis, mild aortic regurgitation, moderate mitral regurgitation and RVSP 53 mmHg. No other significant changes. FINDINGS  Left Ventricle: Left ventricular ejection fraction, by estimation, is 60 to 65%. The left ventricle has normal function. The left ventricle has no regional wall motion abnormalities. The left ventricular internal cavity size was normal in size. There is  mild left ventricular hypertrophy. Left ventricular diastolic function could not be evaluated. Right Ventricle: The right ventricular size is normal. No increase in right ventricular wall thickness. Right ventricular systolic function is low normal. There is moderately elevated pulmonary artery systolic pressure. The tricuspid regurgitant velocity  is 3.22 m/s, and with an assumed right atrial pressure of 8 mmHg, the estimated right ventricular systolic pressure is 49.5 mmHg. Left [facility]: Left atrial size was severely dilated. Right [facility]: Right atrial size was moderately dilated. Pericardium: There is no evidence of pericardial effusion.  Mitral Valve: The mitral valve is abnormal. Mild mitral valve regurgitation. There is a Mitra-Clip present in the mitral position. MV peak gradient, 11.8 mmHg. The mean mitral valve gradient is 4.5 mmHg. Tricuspid Valve: The tricuspid valve is normal in structure. Tricuspid valve regurgitation is mild. Aortic Valve: The aortic valve is calcified. Aortic valve regurgitation is trivial. Moderate aortic stenosis is present. Aortic valve mean gradient measures 19.0 mmHg. Aortic valve peak gradient measures 36.9 mmHg. Aortic valve area, by VTI measures 1.11  cm. Pulmonic Valve: The pulmonic valve was normal in structure. Pulmonic valve regurgitation is mild. Aorta: Aortic dilatation noted. There is mild dilatation of the aortic root, measuring 39 mm. Venous: The inferior vena cava is normal in size with less than 50% respiratory variability, suggesting right atrial pressure of 8 mmHg. IAS/Shunts: The interatrial septum was not assessed. Additional Comments: There is a large pleural effusion.  LEFT VENTRICLE PLAX 2D LVIDd:         4.80 cm LVIDs:         3.00 cm LV PW:         1.10 cm LV IVS:        1.30 cm LVOT diam:     2.40 cm LV SV:         60 LV SV Index:   30 LVOT Area:     4.52 cm  LV Volumes (MOD) LV vol d, MOD A2C: 129.0 ml LV vol d, MOD A4C:  62.4 ml LV vol s, MOD A2C: 50.6 ml LV vol s, MOD A4C: 20.5 ml LV SV MOD A2C:     78.4 ml LV SV MOD A4C:     62.4 ml LV SV MOD BP:      60.0 ml RIGHT VENTRICLE            IVC RV S prime:     9.46 cm/s  IVC diam: 2.70 cm TAPSE (M-mode): 1.5 cm LEFT ATRIUM              Index        RIGHT ATRIUM           Index LA diam:        4.40 cm  2.16 cm/m   RA Area:     23.80 cm LA Vol (A2C):   96.7 ml  47.47 ml/m  RA Volume:   59.80 ml  29.35 ml/m LA Vol (A4C):   116.0 ml 56.94 ml/m LA Biplane Vol: 117.0 ml 57.43 ml/m  AORTIC VALVE                     PULMONIC VALVE AV Area (Vmax):    1.31 cm      PR End Diast Vel: 2.16 msec AV Area (Vmean):   1.30 cm AV Area (VTI):     1.11 cm  AV Vmax:           303.60 cm/s AV Vmean:          196.400 cm/s AV VTI:            0.544 m AV Peak Grad:      36.9 mmHg AV Mean Grad:      19.0 mmHg LVOT Vmax:         87.95 cm/s LVOT Vmean:        56.600 cm/s LVOT VTI:          0.134 m LVOT/AV VTI ratio: 0.25  AORTA Ao Root diam: 3.90 cm Ao Asc diam:  3.60 cm MITRAL VALVE                TRICUSPID VALVE MV Area (PHT): 2.99 cm     TR Peak grad:   41.5 mmHg MV Area VTI:   1.53 cm     TR Vmax:        322.00 cm/s MV Peak grad:  11.8 mmHg MV Mean grad:  4.5 mmHg     SHUNTS MV Vmax:       1.72 m/s     Systemic VTI:  0.13 m MV Vmean:      90.2 cm/s    Systemic Diam: 2.40 cm MV Decel Time: 254 msec MV E velocity: 159.00 cm/s Emeline Calender Electronically signed by Emeline Calender Signature Date/Time: 12/08/2023/5:06:32 PM    Final    DG ESOPHAGUS W SINGLE CM (SOL OR THIN BA) Result Date: 12/08/2023 CLINICAL DATA:  Provided history: Dysphagia. EXAM: ESOPHAGUS/BARIUM SWALLOW TECHNIQUE: A single contrast esophagram was performed using thin liquid barium. The exam was performed by Delon Beagle NP and was supervised and interpreted by Dr. Rockey Childs. FLUOROSCOPY: Radiation Exposure Index (as provided by the fluoroscopic device): 4.30 mGy Kerma COMPARISON:  None. FINDINGS: Markedly limited examination due to limited patient cooperation (with the patient only swallowing small volumes of contrast). The patient did not swallow sufficient volumes of contrast to adequately assess for an esophageal stricture. Additionally, the patient refused to swallow a tablet. Small collection of contrast along  the left anterolateral aspect of the cervical esophagus most consistent with a Killian-Jamieson diverticulum. Within described limitations, no significant esophageal dysmotility or gastroesophageal reflux observed. No appreciable hiatal hernia. IMPRESSION: 1. Markedly limited examination due to limited patient cooperation, as described. The patient did not swallow sufficient volumes of  contrast to adequately assess for an esophageal stricture. Additionally, the patient refused to swallow a barium tablet. 2. 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 Electronically Signed   By: Rockey Childs D.O.   On: 12/08/2023 12:54         LOS: 3 days   Time spent= 40 mins    Deliliah Room, MD [facility]  If 7PM-7AM, please contact night-coverage  12/10/2023, 9:34 AM

## 2023-12-10 NOTE — Progress Notes (Addendum)
 Speech Language Pathology Treatment: Dysphagia  Patient Details Name: Gabriel Ayala MRN: [id number] DOB: 1935/12/01 Today's Date: 12/10/2023 Time: 9069-9054 SLP Time Calculation (min) (ACUTE ONLY): 15 min  Assessment / Plan / Recommendation Clinical Impression  Pt seen for brief dysphagia tx session with ice chips/tsp thin with pt expelling a portion of thin liquids from oral cavity and stating I get strangled sometimes.  Pt agreeable to ice chips and min amounts of water via 1/2 tsp amounts, but declined further po trials.  Nursing stated pt has remained NPO with GOC being determined currently.  Pt requested food, but then quickly declined when various foods offered.  Continued sips of thin with partial reclining required d/t pain level.  Attempted to maintain head neutral positioning, but this was difficult d/t pain.  ST will continue to f/u with recommendation for NPO with QID oral care with completion prior to any ice chips/thin via tsp given by staff during acute stay.    HPI HPI: Patient is an 88 y.o. male with medical history significant for GERD, neuropathy, chronic HFpEE, atrial fibrillation status post Watchman procedure, and severe mitral regurgitation status post MitraClip who [facility] on 9/29 with multiple complaints including generalized weakness, aches and pains, nausea, constipation, and difficulty voiding over the past 3 days. Patient had MBS on 5/24 at outside hospital, where he presented with an oral and pharyngeal dysphagia with a high risk for aspiration and penetration. Patient had EGD on 5/28 at outside hospital which showed slight narrowing at the upper esophagus and GE junction which were dilated. Swallow evaluation completed with NPO status recommended.  ST f/u for dysphagia tx/management.      SLP Plan  Continue with current plan of care          Recommendations  Diet recommendations: NPO;Other(comment) (ice chips/sips of thin allowed after oral care  completion) Liquids provided via: Teaspoon Medication Administration: Crushed with puree                  Oral care QID;Oral care prior to ice chip/H20;Staff/trained caregiver to provide oral care (pt can assist)     Dysphagia, oropharyngeal phase (R13.12)     Continue with current plan of care     Pat Miku Udall,M.S.,CCC-SLP  12/10/2023, 1:01 PM

## 2023-12-10 NOTE — Evaluation (Signed)
 Occupational Therapy Evaluation Patient Details Name: Gabriel Ayala MRN: [id number] DOB: 11/21/1935 Today's Date: 12/10/2023   History of Present Illness   88 yo male admitted with acute with weakness, N/V, acute on chronic HF, hyponatremia. Hx of neuropathy, HF, Afib, chronic wounds, sever MR, chronic hip pain     Clinical Impressions Patient a limited historian however reports pta he lives in an apt with hired pca services with family at the [address] and on the [address], was using a RW and w/c interchangable for mobility due to chronic hip pain and had assist for ADL's and IADL's. Currently, patient presents with deficits outlined below (see OT Problem List for details) most significantly pain, decreased skin integrity, feeding, vision, hearing, balance, activity tolerance on supplemental O2, generalized muscle weakness with decreased ROM and coordination limiting BADL's (max-total A LB) and functional mobility performance (max A +2 EOB sitting). Patient motivated this session and despite pain was eager to sit EOB but then unable to progress to recliner due to pain and fatigue. Patient requires continued Acute care hospital level OT services to progress safety and functional performance and allow for discharge. Patient will benefit from continued inpatient follow up therapy, <3 hours/day.        If plan is discharge home, recommend the following:   Two people to help with walking and/or transfers;Two people to help with bathing/dressing/bathroom;Assistance with feeding;Assist for transportation;Help with stairs or ramp for entrance;Direct supervision/assist for medications management;Direct supervision/assist for financial management;Supervision due to cognitive status     Functional Status Assessment   Patient has had a recent decline in their functional status and demonstrates the ability to make significant improvements in function in a reasonable and predictable amount of  time.     Equipment Recommendations   BSC/3in1;Hospital bed;Hoyer lift      Precautions/Restrictions   Precautions Precautions: Fall Restrictions Weight Bearing Restrictions Per Provider Order: No     Mobility Bed Mobility Overal bed mobility: Needs Assistance Bed Mobility: Rolling, Supine to Sit, Sit to Supine Rolling: Max assist, Used rails   Supine to sit: Max assist, HOB elevated, Used rails Sit to supine: Max assist, HOB elevated, Used rails   General bed mobility comments: allowed patient ot move at his own pace due to high sensitivity to touch and pain when handled    Transfers Overall transfer level:  (unable to progress from EOB)                        Balance Overall balance assessment: Needs assistance Sitting-balance support: Single extremity supported, Feet supported Sitting balance-Leahy Scale: Poor   Postural control: Posterior lean, Right lateral lean, Left lateral lean                                 ADL either performed or assessed with clinical judgement   ADL Overall ADL's : Needs assistance/impaired Eating/Feeding: NPO   Grooming: Wash/dry hands;Wash/dry face;Oral care;Bed level;Sitting Grooming Details (indicate cue type and reason): suction toothbrush and face washing EOB sitting Upper Body Bathing: Maximal assistance;Bed level   Lower Body Bathing: Total assistance;Bed level   Upper Body Dressing : Maximal assistance;Bed level   Lower Body Dressing: Total assistance;Bed level       Toileting- Clothing Manipulation and Hygiene: Total assistance Toileting - Clothing Manipulation Details (indicate cue type and reason): Foley and bed pad     Functional mobility during  ADLs: Maximal assistance;+2 for physical assistance;+2 for safety/equipment (EOB) General ADL Comments: cues for pacing and breathing strategies with painful basic mobility     Vision Baseline Vision/History: 1 Wears glasses Ability to See  in Adequate Light: 1 Impaired Patient Visual Report: Blurring of vision Vision Assessment?: Wears glasses for reading     Perception Perception: Impaired Preception Impairment Details: Spatial orientation     Praxis Praxis: Impaired Praxis Impairment Details: Organization, Motor planning     Pertinent Vitals/Pain Pain Assessment Pain Assessment: Faces Faces Pain Scale: Hurts whole lot (9/10) Breathing: occasional labored breathing, short period of hyperventilation Negative Vocalization: occasional moan/groan, low speech, negative/disapproving quality Facial Expression: facial grimacing Body Language: tense, distressed pacing, fidgeting Consolability: distracted or reassured by voice/touch PAINAD Score: 6 Pain Location: pain with any attempts at moving UE/LEs Pain Descriptors / Indicators: Grimacing, Burning, Discomfort, Moaning Pain Intervention(s): Limited activity within patient's tolerance, Monitored during session, Repositioned, Premedicated before session     Extremity/Trunk Assessment Upper Extremity Assessment Upper Extremity Assessment: Right hand dominant;RUE deficits/detail;LUE deficits/detail;Generalized weakness RUE: Shoulder pain with ROM RUE Coordination: decreased fine motor LUE: Shoulder pain with ROM LUE Coordination: decreased fine motor   Lower Extremity Assessment Lower Extremity Assessment: Defer to PT evaluation   Cervical / Trunk Assessment Cervical / Trunk Assessment: Kyphotic   Communication Communication Communication: Impaired Factors Affecting Communication: Hearing impaired   Cognition Arousal: Lethargic Behavior During Therapy: WFL for tasks assessed/performed Cognition: Cognition impaired   Orientation impairments: Situation Awareness: Online awareness impaired Memory impairment (select all impairments): Short-term memory Attention impairment (select first level of impairment): Sustained attention Executive functioning impairment  (select all impairments): Reasoning, Problem solving, Sequencing OT - Cognition Comments: decreased tolerance for testing however slower processing and increased time while using phone and for basic ADL tasks bed and EOB level                 Following commands: Impaired Following commands impaired: Follows one step commands with increased time     Cueing  General Comments   Cueing Techniques: Verbal cues;Tactile cues  SpO2 on 2 ltrs 94-96% EOB mobility and light UE ADL's, poor skin integrity, low tolerance to activity           Home Living Family/patient expects to be discharged to:: Skilled nursing facility Living Arrangements:  (patient reports he lives alone in apt with hired PCA)                           Home Equipment: Agricultural consultant (2 wheels);Wheelchair - manual   Additional Comments: assist for bathing, IADL's      Prior Functioning/Environment Prior Level of Function : Needs assist             Mobility Comments: RW vs WC-limited by chronic hip pain      OT Problem List: Decreased strength;Decreased range of motion;Decreased activity tolerance;Impaired balance (sitting and/or standing);Impaired vision/perception;Decreased coordination;Decreased cognition;Decreased safety awareness;Decreased knowledge of use of DME or AE;Decreased knowledge of precautions;Cardiopulmonary status limiting activity;Impaired UE functional use;Pain   OT Treatment/Interventions: Self-care/ADL training;Therapeutic exercise;Neuromuscular education;Energy conservation;DME and/or AE instruction;Therapeutic activities;Cognitive remediation/compensation;Visual/perceptual remediation/compensation;Patient/family education;Balance training      OT Goals(Current goals can be found in the care plan section)   Acute Rehab OT Goals Patient Stated Goal: to keep trying OT Goal Formulation: With patient Time For Goal Achievement: 12/24/23 Potential to Achieve Goals: Fair ADL  Goals Pt Will Perform Eating: with min assist Pt Will Perform Grooming: with  min assist;sitting Pt Will Perform Upper Body Bathing: with min assist;sitting Pt Will Transfer to Toilet: with +2 assist;with mod assist   OT Frequency:  Min 2X/week       AM-PAC OT 6 Clicks Daily Activity     Outcome Measure Help from another person eating meals?: Total Help from another person taking care of personal grooming?: A Lot Help from another person toileting, which includes using toliet, bedpan, or urinal?: Total Help from another person bathing (including washing, rinsing, drying)?: Total Help from another person to put on and taking off regular upper body clothing?: A Lot Help from another person to put on and taking off regular lower body clothing?: Total 6 Click Score: 8   End of Session Equipment Utilized During Treatment: Gait belt;Oxygen Nurse Communication: Mobility status  Activity Tolerance: Patient limited by fatigue;Patient limited by pain Patient left: in bed;with call bell/phone within reach;with bed alarm set  OT Visit Diagnosis: Other abnormalities of gait and mobility (R26.89);Muscle weakness (generalized) (M62.81);Low vision, both eyes (H54.2);Feeding difficulties (R63.3);Cognitive communication deficit (R41.841);Pain Pain - part of body: Arm;Leg;Hip                Time: 8841-8758 OT Time Calculation (min): 43 min Charges:  OT General Charges $OT Visit: 1 Visit OT Evaluation $OT Eval Low Complexity: 1 Low OT Treatments $Therapeutic Activity: 8-22 mins  Wallace Cogliano OT/L Acute Rehabilitation Department  [phone]  12/10/2023, 3:45 PM

## 2023-12-10 NOTE — TOC Progression Note (Signed)
 Transition of Care [facility]) - Progression Note    Patient Details  Name: Gabriel Ayala MRN: [id number] Date of Birth: [birth year]  Transition of Care [facility]) CM/SW Contact  Amol Domanski, Nathanel, RN Phone Number: 12/10/2023, 2:05 PM  Clinical Narrative: Noted NPO,high Risk aspiration. ST following. PT recc ST SNF. Will await diet prior faxing out for ST SNF-noted prefer [name](gone there in past). Palliative care team following.      Expected Discharge Plan: Skilled Nursing Facility Barriers to Discharge: Continued Medical Work up               Expected Discharge Plan and Services   Discharge Planning Services: CM Consult Post Acute Care Choice: Skilled Nursing Facility Living arrangements for the past 2 months: Single Family Home                                       Social Drivers of Health (SDOH) Interventions SDOH Screenings   Food Insecurity: No Food Insecurity (12/08/2023)  Housing: Low Risk  (12/08/2023)  Transportation Needs: No Transportation Needs (12/08/2023)  Utilities: Not At Risk (12/08/2023)  Social Connections: Socially Isolated (12/08/2023)  Tobacco Use: Low Risk  (12/07/2023)    Readmission Risk Interventions     No data to display

## 2023-12-10 NOTE — Consult Note (Signed)
 Consultation Note Date: 12/10/2023   Patient Name: Gabriel Ayala  DOB: [birth year]  MRN: [id number]  Age / Sex: 88 y.o., male  PCP: [name], MD Referring Physician: Dino Antu, MD  Reason for Consultation: Establishing goals of care  HPI/Patient Profile: 88 y.o. male   admitted on 12/07/2023     Gabriel Ayala is a 88 y.o. male with a hx of hypertension, HFpEF, persistent atrial fibrillation s/p Watchman, moderate to severe MR s/p MitraClip in 2023.   Clinical Assessment and Goals of Care: Patient is admitted to the hospital medicine service, he presented to the emergency department on 12/07/2023 for generalized weakness,elevated proBNP of 35,000 Chest x-ray showed Small to moderate left-greater-than-right pleural effusions with bibasilar opacities favored to reflect atelectasis, not significantly changed compared to the prior exam. Echocardiogram showed a normal LVEF of 60 to 65%, no RWMA, mild LVH, normal RV function, severely dilated left atrium, moderately dilated right atrium, abnormal mitral valve with mild regurgitation RVSP of 53 mmHg, indicated a slightly elevated right atrial pressure of 8 mmHg, and mild aortic root dilation measuring 39 mm. Patient was started on IV diuresis but developed an AKI and these were held. Renal has also been consulted, gentle IVF trial to follow.     SLP has deemed the patient to have high aspiration risk, hence NPO except ice chips as of now.       CT abdomen pelvis showed rectal wall thickening with rectal proctitis bilateral pleural effusion.  He has worsening AKI with minimal urine output so nephrology was consulted on 12/10/23. Poor prognosis and palliative team has been consulted for goals of care discussions, he has a code status of DNR.  TOC note reviewed, he has gone to [facility] rehab in the past.  Background history:  patient's  healthcare power of attorney who is his caregiver as well.  Patient has 2 children son is in [place] and the daughter is in [place] .  Power of attorney mentioned that patient has been doing fairly well and has been independent up until a few weeks ago.  He has chronic hip pain as a result of which his ambulatory status is poor but he still is able to walk at times.  He bathes himself.  Chart reviewed, patient seen and examined, discussed with RN. Call placed and also discussed with HCPOA.  TRH cardiology and renal input shared with HCPOA.   Palliative medicine is specialized medical care for people living with serious illness. It focuses on providing relief from the symptoms and stress of a serious illness. The goal is to improve quality of life for both the patient and the family.  Goals of care: Broad aims of medical therapy in relation to the patient's values and preferences. Our aim is to provide medical care aimed at enabling patients to achieve the goals that matter most to them, given the circumstances of their particular medical situation and their constraints.      HCPOA Gabriel Ayala is HCPOA [phone]  who is his caregiver.   SUMMARY OF RECOMMENDATIONS    Goals of care discussions:  Discussed with HCPOA Gabriel who is also his caregiver, patient came from home. He has long standing issues with dysphagia. Had a reasonably good quality of life up until very recently. Has not had renal issues as per HCPOA. I discussed with her about SLP recommendations of continued NPO.  At present she is not in favor of dialysis or feeding tubes. She plans on notifying both son and daughter so that they are also informed and in agreement.  HCPOA states -  he has done this before, then he has rallied and after a month or so, he bounces back. I hope he can do SNF rehab this time.   I shared with her frankly but compassionately about the serious nature of this hospitalization. She  wishes for a time limited trial of current interventions for at least the next couple of days, she is hopeful for some renal recovery and for him to be cleared for some safest possible PO diet.   Opioid rotate from IV Fentanyl to IV Dilaudid  to better manage pain.   Monitor hospital course.   PMT to follow closely, thank you for the consult.   Code Status/Advance Care Planning: DNR   Symptom Management:     Palliative Prophylaxis:  Frequent Pain Assessment  Additional Recommendations (Limitations, Scope, Preferences): Avoid feeding tubes and avoid dialysis as per discussions with HCPOA at the time of this initial consult.   Psycho-social/Spiritual:  Desire for further Chaplaincy support:yes Additional Recommendations: Caregiving  Support/Resources  Prognosis:  Unable to determine  Discharge Planning: To Be Determined      Primary Diagnoses: Present on Admission:  Acute on chronic heart failure with preserved ejection fraction (HFpEF) (HCC)  Paroxysmal atrial fibrillation (HCC)  Essential hypertension  Proctitis  Hyponatremia  Acute urinary retention  Pleural effusion, bilateral  Chronic wound   I have reviewed the medical record, interviewed the patient and family, and examined the patient. The following aspects are pertinent.  Past Medical History:  Diagnosis Date   A-fib [facility])    CHF (congestive heart failure) (HCC)    Neuropathy    Social History   Socioeconomic History   Marital status: Widowed    Spouse name: Not on file   Number of children: Not on file   Years of education: Not on file   Highest education level: Not on file  Occupational History   Not on file  Tobacco Use   Smoking status: Never   Smokeless tobacco: Never  Substance and Sexual Activity   Alcohol use: Yes    Alcohol/week: 2.0 standard drinks of alcohol    Types: 2 Glasses of wine per week    Comment: daily   Drug use: No   Sexual activity: Not Currently    Partners: Male   Other Topics Concern   Not on file  Social History Narrative   Not on file   Social Drivers of Health   Financial Resource Strain: Not on file  Food Insecurity: No Food Insecurity (12/08/2023)   Hunger Vital Sign    Worried About Running Out of Food in the Last Year: Never true    Ran Out of Food in the Last Year: Never true  Transportation Needs: No Transportation Needs (12/08/2023)   PRAPARE - Administrator, Civil Service (Medical): No    Lack of Transportation (Non-Medical): No  Physical Activity: Not on file  Stress: Not  on file  Social Connections: Socially Isolated (12/08/2023)   Social Connection and Isolation Panel    Frequency of Communication with Friends and Family: Once a week    Frequency of Social Gatherings with Friends and Family: Once a week    Attends Religious Services: 1 to 4 times per year    Active Member of [employer] or Organizations: No    Attends Banker Meetings: Never    Marital Status: Widowed   History reviewed. No pertinent family history. Scheduled Meds:  Chlorhexidine Gluconate Cloth  6 each Topical Daily   collagenase   Topical Daily   enoxaparin (LOVENOX) injection  30 mg Subcutaneous QHS   gabapentin   100 mg Oral TID   Influenza vac split trivalent PF  0.5 mL Intramuscular Tomorrow-1000   pantoprazole   40 mg Oral BID   pneumococcal 20-valent conjugate vaccine  0.5 mL Intramuscular Tomorrow-1000   sertraline   50 mg Oral Daily   sodium chloride  flush  3 mL Intravenous Q12H   Continuous Infusions: PRN Meds:.acetaminophen  **OR** acetaminophen , bisacodyl, HYDROmorphone  (DILAUDID ) injection, oxyCODONE, polyethylene glycol, prochlorperazine Medications Prior to Admission:  Prior to Admission medications   Medication Sig Start Date End Date Taking? Authorizing Provider  acetaminophen  (TYLENOL ) 500 MG tablet Take 1,000 mg by mouth in the morning, at noon, and at bedtime.   Yes [provider]  alum & mag  hydroxide-simeth (MAALOX PLUS) 400-400-40 MG/5ML suspension Take 30 mLs by mouth every 4 (four) hours as needed for indigestion.   Yes [provider]  docusate sodium (COLACE) 100 MG capsule Take 100 mg by mouth daily.   Yes [provider]  dupilumab  (DUPIXENT ) 300 MG/2ML prefilled syringe Inject 300 mg into the skin every 14 (fourteen) days.   Yes [provider]  furosemide  (LASIX ) 80 MG tablet Take by mouth.   Yes [provider]  gabapentin  (NEURONTIN ) 100 MG capsule Take 100 mg by mouth 3 (three) times daily.   Yes [provider]  hydrOXYzine (ATARAX) 25 MG tablet Take 25 mg by mouth at bedtime. 11/17/23  Yes [provider]  Multiple Vitamins-Minerals (ICAPS AREDS 2 PO) Take 1 tablet by mouth daily.   Yes [provider]  Olopatadine HCl (PATADAY OP) Place 1 drop into both eyes daily.   Yes [provider]  omeprazole  (PRILOSEC) 20 MG capsule Take 1 capsule (20 mg total) by mouth daily. 20 MG CAPSULE: Give 1 capsule by mouth daily Patient taking differently: Take 20 mg by mouth daily. 05/01/20  Yes [place], Amy E, NP  sertraline  (ZOLOFT ) 50 MG tablet Take 50 mg by mouth daily.   Yes [provider]  triamcinolone  cream (KENALOG ) 0.1 % Apply 1 Application topically daily.   Yes [provider]  Nutritional Supplements (ENSURE PO) Take 1 Container by mouth in the morning and at bedtime. (prefers Vanilla) d/t decreased intake. Patient not taking: Reported on 12/08/2023 03/05/20   [provider]  sucralfate (CARAFATE) 1 GM/10ML suspension Take 1 g by mouth. Patient not taking: Reported on 12/08/2023 01/17/21 12/17/23  [provider]   Allergies  Allergen Reactions   Amoxicillin-Pot Clavulanate Other (See Comments)    Unknown    Duloxetine Hcl Other (See Comments)    Unknown    Pregabalin Other (See Comments)    Unknown    Gluten Meal Other (See Comments)    Unknown    Oxycodone Other  (See Comments)    Unknown    Review of Systems +weakness  Physical Exam Weak and confused appearing elderly gentleman Mild distress Deconditioned Abdomen not distended Regular work of breathing  Vital Signs: BP (!) 101/46 (BP Location: Right Arm)   Pulse 70   Temp 98 F (36.7 C)   Resp 14   Ht 5' 11 (1.803 m)   Wt 65.5 kg   SpO2 96%   BMI 20.14 kg/m  Pain Scale: 0-10   Pain Score: 10-Worst pain ever   SpO2: SpO2: 96 % O2 Device:SpO2: 96 % O2 Flow Rate: .O2 Flow Rate (L/min): 2 L/min  IO: Intake/output summary:  Intake/Output Summary (Last 24 hours) at 12/10/2023 1343 Last data filed at 12/10/2023 1100 Gross per 24 hour  Intake 13 ml  Output --  Net 13 ml    LBM: Last BM Date : 12/06/23 Baseline Weight: Weight: 66.3 kg Most recent weight: Weight: 65.5 kg     Palliative Assessment/Data:    PPS 40% Time In:  12 Time Out:  1320 Time Total:  80 Greater than 50%  of this time was spent counseling and coordinating care related to the above assessment and plan.  Signed by: Lonia Serve, MD   Please contact Palliative Medicine Team phone at [phone] for questions and concerns.  For individual provider: See Tracey

## 2023-12-11 ENCOUNTER — Inpatient Hospital Stay (HOSPITAL_COMMUNITY)

## 2023-12-11 DIAGNOSIS — I5033 Acute on chronic diastolic (congestive) heart failure: Secondary | ICD-10-CM | POA: Diagnosis not present

## 2023-12-11 LAB — CBC
HCT: 37.2 % — ABNORMAL LOW (ref 39.0–52.0)
Hemoglobin: 11 g/dL — ABNORMAL LOW (ref 13.0–17.0)
MCH: 29 pg (ref 26.0–34.0)
MCHC: 29.6 g/dL — ABNORMAL LOW (ref 30.0–36.0)
MCV: 98.2 fL (ref 80.0–100.0)
Platelets: 276 K/uL (ref 150–400)
RBC: 3.79 MIL/uL — ABNORMAL LOW (ref 4.22–5.81)
RDW: 14.6 % (ref 11.5–15.5)
WBC: 11.1 K/uL — ABNORMAL HIGH (ref 4.0–10.5)
nRBC: 0 % (ref 0.0–0.2)

## 2023-12-11 LAB — GLUCOSE, CAPILLARY
Glucose-Capillary: 136 mg/dL — ABNORMAL HIGH (ref 70–99)
Glucose-Capillary: 85 mg/dL (ref 70–99)
Glucose-Capillary: 87 mg/dL (ref 70–99)

## 2023-12-11 LAB — BASIC METABOLIC PANEL WITH GFR
Anion gap: 15 (ref 5–15)
BUN: 40 mg/dL — ABNORMAL HIGH (ref 8–23)
CO2: 19 mmol/L — ABNORMAL LOW (ref 22–32)
Calcium: 8.8 mg/dL — ABNORMAL LOW (ref 8.9–10.3)
Chloride: 100 mmol/L (ref 98–111)
Creatinine, Ser: 4.32 mg/dL — ABNORMAL HIGH (ref 0.61–1.24)
GFR, Estimated: 13 mL/min — ABNORMAL LOW (ref >60)
Glucose, Bld: 92 mg/dL (ref 70–99)
Potassium: 5.8 mmol/L — ABNORMAL HIGH (ref 3.5–5.1)
Sodium: 134 mmol/L — ABNORMAL LOW (ref 135–145)

## 2023-12-11 MED ORDER — IPRATROPIUM-ALBUTEROL 0.5-2.5 (3) MG/3ML IN SOLN
3.0000 mL | RESPIRATORY_TRACT | Status: DC | PRN
  Administered 2023-12-11: 3 mL via RESPIRATORY_TRACT
  Filled 2023-12-11: qty 3

## 2023-12-11 MED ORDER — HYDROMORPHONE HCL 1 MG/ML IJ SOLN
1.0000 mg | INTRAMUSCULAR | Status: DC | PRN
  Administered 2023-12-11 (×2): 1 mg via INTRAVENOUS
  Filled 2023-12-11 (×2): qty 1

## 2023-12-11 MED ORDER — NALOXONE HCL 2 MG/2ML IJ SOSY
2.0000 mg | PREFILLED_SYRINGE | Freq: Once | INTRAMUSCULAR | Status: DC
Start: 2023-12-11 — End: 2023-12-11
  Filled 2023-12-11: qty 2

## 2023-12-11 MED ORDER — GLYCOPYRROLATE 0.2 MG/ML IJ SOLN
0.2000 mg | Freq: Four times a day (QID) | INTRAMUSCULAR | Status: DC
  Administered 2023-12-11: 0.2 mg via INTRAVENOUS
  Filled 2023-12-11: qty 1

## 2023-12-11 MED ORDER — HYDROMORPHONE HCL 1 MG/ML IJ SOLN: 1.0000 mg | INTRAMUSCULAR | Status: DC | PRN

## 2023-12-11 MED ORDER — NALOXONE HCL 0.4 MG/ML IJ SOLN
0.4000 mg | INTRAMUSCULAR | Status: DC | PRN
  Administered 2023-12-11: 0.4 mg via INTRAVENOUS

## 2023-12-11 MED ORDER — SCOPOLAMINE 1 MG/3DAYS TD PT72
1.0000 | MEDICATED_PATCH | TRANSDERMAL | Status: DC
  Administered 2023-12-11: 1 mg via TRANSDERMAL
  Filled 2023-12-11: qty 1

## 2023-12-11 MED ORDER — NALOXONE HCL 0.4 MG/ML IJ SOLN
INTRAMUSCULAR | Status: AC
  Filled 2023-12-11: qty 1

## 2023-12-11 MED ORDER — HYDROMORPHONE HCL 1 MG/ML IJ SOLN
1.0000 mg | Freq: Once | INTRAMUSCULAR | Status: AC
  Administered 2023-12-11: 1 mg via INTRAVENOUS
  Filled 2023-12-11: qty 1

## 2023-12-11 MED ORDER — LORAZEPAM 2 MG/ML IJ SOLN
2.0000 mg | INTRAMUSCULAR | Status: DC | PRN
  Administered 2023-12-11: 2 mg via INTRAVENOUS
  Filled 2023-12-11: qty 1

## 2023-12-11 MED ORDER — FUROSEMIDE 10 MG/ML IJ SOLN
40.0000 mg | Freq: Once | INTRAMUSCULAR | Status: AC
  Administered 2023-12-11: 40 mg via INTRAVENOUS
  Filled 2023-12-11: qty 4

## 2023-12-11 MED ORDER — NALOXONE HCL 0.4 MG/ML IJ SOLN
INTRAMUSCULAR | Status: AC
  Filled 2023-12-11: qty 3

## 2023-12-13 LAB — BLOOD GAS, ARTERIAL
Acid-base deficit: 14.8 mmol/L — ABNORMAL HIGH (ref 0.0–2.0)
Bicarbonate: 21.7 mmol/L (ref 20.0–28.0)
Drawn by: 336832
FIO2: 100 %
O2 Saturation: 84.3 %
Patient temperature: 36.2
pCO2 arterial: >123 mmHg (ref 32–48)
pH, Arterial: <6.95 — CL (ref 7.35–7.45)
pO2, Arterial: 56 mmHg — ABNORMAL LOW (ref 83–108)

## 2024-01-09 NOTE — Death Summary Note (Signed)
 DEATH SUMMARY   Patient Details  Name: Gabriel Ayala MRN: [id number] DOB: 06-09-1935 ERE:Dfpuy, Gabriel Cea, MD Admission/Discharge Information   Admit Date:  12-26-2023  Date of Death: Date of Death: 12-30-2023  Time of Death: Time of Death: 1131  Length of Stay: 4   Principle Cause of death:  Acute hypoxic hypercapnic respiratory failure in the setting of acute CHF, worsening AKI, Hyperkalemia and uremic encephalopathy.   Hospital Diagnoses: Principal Problem:   Acute on chronic heart failure with preserved ejection fraction (HFpEF) (HCC) Active Problems:   Essential hypertension   Paroxysmal atrial fibrillation (HCC)   Acute urinary retention   Proctitis   Hyponatremia   Pleural effusion, bilateral   Chronic wound   Hospital Course:  88 y.o. male past medical history significant for neuropathy, chronic HFpEF, atrial fibrillation status post Watchman procedure, severe mitral regurgitation status post MitraClip comes in complaining of generalized weakness, nausea vomiting difficulty voiding that started 3 days prior to admission, in the ED was found to have satting in the 90s, sodium of 129 unremarkable with blood cell count, hemoglobin of 10 proBNP greater than 2000, CT abdomen pelvis showed rectal wall thickening with rectal proctitis bilateral pleural effusion.  He had worsening AKI with minimal urine output so nephrology was consulted on 12/10/23. He was managed in the hospital for acute on chronic HFpEF, worsening AKI with hyperkalemia and uremic encephalopathy, ARF with hypoxia, dysphagia, hyponatremia amongst other multiple comorbidities.   Condition got worse on the morning of 10/3 including worsening hypoxia, altered mentation and severe acute respiratory acidosis with hypercapnia. I spoke to patient's caregiver and POA, Candice on the phone. She came to the hospital and I spoke to her at the bedside as well. Decision was made to start the comfort care measures.    No spontaneous breathing, fixed dilated pupils, no heart sounds appreciated Patient was pronounced dead at 11:31 am on December 30, 2023.       Procedures: None  Consultations: ST, Palliative, Nephrology, Cardiology  The results of significant diagnostics from this hospitalization (including imaging, microbiology, ancillary and laboratory) are listed below for reference.   Significant Diagnostic Studies: DG Chest Port 1 View Result Date: 12/30/23 CLINICAL DATA:  Shortness of breath. EXAM: PORTABLE CHEST 1 VIEW COMPARISON:  12/10/2023 FINDINGS: Rotated low volume film. The cardio pericardial silhouette is enlarged. Bibasilar collapse/consolidation with bilateral moderate pleural effusions. Bones are diffusely demineralized. Telemetry leads overlie the chest. IMPRESSION: Study limited by low lung volumes and substantial patient rotation. Cardiomegaly with bibasilar collapse/consolidation with bilateral moderate pleural effusions. Electronically Signed   By: Camellia Candle M.D.   On: 2023/12/30 07:24   DG CHEST PORT 1 VIEW Result Date: 12/10/2023 EXAM: 1 VIEW(S) XRAY OF THE CHEST 12/10/2023 11:08:00 AM COMPARISON: Chest x-ray 12-26-2023. CLINICAL HISTORY: Heart failure (HCC) [id number]. Heart failure. FINDINGS: LUNGS AND PLEURA: There are small bilateral pleural effusions, right greater than left. Right pleural effusion has increased. There are increasing right mid and lower lung airspace opacities. No pneumothorax. HEART AND MEDIASTINUM: The heart is enlarged, unchanged. BONES AND SOFT TISSUES: No acute fracture. IMPRESSION: 1. Increasing right mid and lower lung airspace opacities. 2. Small bilateral pleural effusions, increasing on the right. 3. Cardiomegaly, unchanged. Electronically signed by: Greig Pique MD 12/10/2023 04:02 PM EDT RP Workstation: [id number]   ECHOCARDIOGRAM COMPLETE Result Date: 12/08/2023    ECHOCARDIOGRAM REPORT   Patient Name:   [name] Date of Exam: 12/08/2023 Medical Rec  #:  [id number]  Height:       72.0 in Accession #:    [id number]       Weight:       180.0 lb Date of Birth:  1935/03/14        BSA:          2.037 m Patient Age:    87 years         BP:           133/50 mmHg Patient Gender: M                HR:           62 bpm. Exam Location:  Inpatient Procedure: 2D Echo, Cardiac Doppler and Color Doppler (Both Spectral and Color            Flow Doppler were utilized during procedure). Indications:    I50.40* Unspecified combined systolic (congestive) and diastolic                 (congestive) heart failure  History:        Patient has no prior history of Echocardiogram examinations.                 CHF, Abnormal ECG, Mitral Valve Disease, Arrythmias:Atrial                 Fibrillation; Risk Factors:Hypertension and Dyslipidemia.                 HefPef. Watchman device. Mitraclip procedure.  Sonographer:    Ellouise Mose RDCS Referring Phys: [id number] TIMOTHY S OPYD IMPRESSIONS  1. Left ventricular ejection fraction, by estimation, is 60 to 65%. The left ventricle has normal function. The left ventricle has no regional wall motion abnormalities. There is mild left ventricular hypertrophy. Left ventricular diastolic function could not be evaluated.  2. Right ventricular systolic function is low normal. The right ventricular size is normal. There is moderately elevated pulmonary artery systolic pressure. The estimated right ventricular systolic pressure is 49.5 mmHg.  3. Left atrial size was severely dilated.  4. Right atrial size was moderately dilated.  5. Large pleural effusion.  6. The mitral valve is abnormal. Mild mitral valve regurgitation. Procedure Date: 07/17/2021.  7. The aortic valve is calcified. Aortic valve regurgitation is trivial. Moderate aortic valve stenosis.  8. Aortic dilatation noted. There is mild dilatation of the aortic root, measuring 39 mm.  9. The inferior vena cava is normal in size with <50% respiratory variability, suggesting right atrial pressure of  8 mmHg. Comparison(s): A prior study was performed on 07/28/2022. THe ejection fraction was 60-65% with mild aortic stenosis, mild aortic regurgitation, moderate mitral regurgitation and RVSP 53 mmHg. No other significant changes. FINDINGS  Left Ventricle: Left ventricular ejection fraction, by estimation, is 60 to 65%. The left ventricle has normal function. The left ventricle has no regional wall motion abnormalities. The left ventricular internal cavity size was normal in size. There is  mild left ventricular hypertrophy. Left ventricular diastolic function could not be evaluated. Right Ventricle: The right ventricular size is normal. No increase in right ventricular wall thickness. Right ventricular systolic function is low normal. There is moderately elevated pulmonary artery systolic pressure. The tricuspid regurgitant velocity  is 3.22 m/s, and with an assumed right atrial pressure of 8 mmHg, the estimated right ventricular systolic pressure is 49.5 mmHg. Left Atrium: Left atrial size was severely dilated. Right Atrium: Right atrial size was moderately dilated. Pericardium: There is no evidence of pericardial  effusion. Mitral Valve: The mitral valve is abnormal. Mild mitral valve regurgitation. There is a Mitra-Clip present in the mitral position. MV peak gradient, 11.8 mmHg. The mean mitral valve gradient is 4.5 mmHg. Tricuspid Valve: The tricuspid valve is normal in structure. Tricuspid valve regurgitation is mild. Aortic Valve: The aortic valve is calcified. Aortic valve regurgitation is trivial. Moderate aortic stenosis is present. Aortic valve mean gradient measures 19.0 mmHg. Aortic valve peak gradient measures 36.9 mmHg. Aortic valve area, by VTI measures 1.11  cm. Pulmonic Valve: The pulmonic valve was normal in structure. Pulmonic valve regurgitation is mild. Aorta: Aortic dilatation noted. There is mild dilatation of the aortic root, measuring 39 mm. Venous: The inferior vena cava is normal in size  with less than 50% respiratory variability, suggesting right atrial pressure of 8 mmHg. IAS/Shunts: The interatrial septum was not assessed. Additional Comments: There is a large pleural effusion.  LEFT VENTRICLE PLAX 2D LVIDd:         4.80 cm LVIDs:         3.00 cm LV PW:         1.10 cm LV IVS:        1.30 cm LVOT diam:     2.40 cm LV SV:         60 LV SV Index:   30 LVOT Area:     4.52 cm  LV Volumes (MOD) LV vol d, MOD A2C: 129.0 ml LV vol d, MOD A4C: 62.4 ml LV vol s, MOD A2C: 50.6 ml LV vol s, MOD A4C: 20.5 ml LV SV MOD A2C:     78.4 ml LV SV MOD A4C:     62.4 ml LV SV MOD BP:      60.0 ml RIGHT VENTRICLE            IVC RV S prime:     9.46 cm/s  IVC diam: 2.70 cm TAPSE (M-mode): 1.5 cm LEFT ATRIUM              Index        RIGHT ATRIUM           Index LA diam:        4.40 cm  2.16 cm/m   RA Area:     23.80 cm LA Vol (A2C):   96.7 ml  47.47 ml/m  RA Volume:   59.80 ml  29.35 ml/m LA Vol (A4C):   116.0 ml 56.94 ml/m LA Biplane Vol: 117.0 ml 57.43 ml/m  AORTIC VALVE                     PULMONIC VALVE AV Area (Vmax):    1.31 cm      PR End Diast Vel: 2.16 msec AV Area (Vmean):   1.30 cm AV Area (VTI):     1.11 cm AV Vmax:           303.60 cm/s AV Vmean:          196.400 cm/s AV VTI:            0.544 m AV Peak Grad:      36.9 mmHg AV Mean Grad:      19.0 mmHg LVOT Vmax:         87.95 cm/s LVOT Vmean:        56.600 cm/s LVOT VTI:          0.134 m LVOT/AV VTI ratio: 0.25  AORTA Ao Root diam: 3.90 cm Ao Asc  diam:  3.60 cm MITRAL VALVE                TRICUSPID VALVE MV Area (PHT): 2.99 cm     TR Peak grad:   41.5 mmHg MV Area VTI:   1.53 cm     TR Vmax:        322.00 cm/s MV Peak grad:  11.8 mmHg MV Mean grad:  4.5 mmHg     SHUNTS MV Vmax:       1.72 m/s     Systemic VTI:  0.13 m MV Vmean:      90.2 cm/s    Systemic Diam: 2.40 cm MV Decel Time: 254 msec MV E velocity: 159.00 cm/s Emeline Calender Electronically signed by Emeline Calender Signature Date/Time: 12/08/2023/5:06:32 PM    Final    DG ESOPHAGUS W SINGLE  CM (SOL OR THIN BA) Result Date: 12/08/2023 CLINICAL DATA:  Provided history: Dysphagia. EXAM: ESOPHAGUS/BARIUM SWALLOW TECHNIQUE: A single contrast esophagram was performed using thin liquid barium. The exam was performed by Delon Beagle NP and was supervised and interpreted by Dr. Rockey Childs. FLUOROSCOPY: Radiation Exposure Index (as provided by the fluoroscopic device): 4.30 mGy Kerma COMPARISON:  None. FINDINGS: Markedly limited examination due to limited patient cooperation (with the patient only swallowing small volumes of contrast). The patient did not swallow sufficient volumes of contrast to adequately assess for an esophageal stricture. Additionally, the patient refused to swallow a tablet. Small collection of contrast along the left anterolateral aspect of the cervical esophagus most consistent with a Killian-Jamieson diverticulum. Within described limitations, no significant esophageal dysmotility or gastroesophageal reflux observed. No appreciable hiatal hernia. IMPRESSION: 1. Markedly limited examination due to limited patient cooperation, as described. The patient did not swallow sufficient volumes of contrast to adequately assess for an esophageal stricture. Additionally, the patient refused to swallow a barium tablet. 2. 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 Electronically Signed   By: Rockey Childs D.O.   On: 12/08/2023 12:54   CT ABDOMEN PELVIS W CONTRAST Result Date: 12/07/2023 CLINICAL DATA:  Acute abdominal pain EXAM: CT ABDOMEN AND PELVIS WITH CONTRAST TECHNIQUE: Multidetector CT imaging of the abdomen and pelvis was performed using the standard protocol following bolus administration of intravenous contrast. RADIATION DOSE REDUCTION: This exam was performed according to the departmental dose-optimization program which includes automated exposure control, adjustment of the mA and/or kV according to patient size and/or use of iterative reconstruction  technique. CONTRAST:  100mL OMNIPAQUE IOHEXOL 300 MG/ML  SOLN COMPARISON:  CT abdomen and pelvis 03/11/2020. FINDINGS: Lower chest: Small left and moderate right pleural effusions are present with bibasilar atelectasis. The heart is enlarged. Hepatobiliary: No focal liver abnormality is seen. No gallstones, gallbladder wall thickening, or biliary dilatation. Pancreas: Unremarkable. No pancreatic ductal dilatation or surrounding inflammatory changes. Spleen: Normal in size without focal abnormality. Adrenals/Urinary Tract: The bilateral adrenal glands are within normal limits. There scattered rounded hypodensities in both kidneys which are too small to characterize there is a 2.3 cm cyst in the right kidney. There are additional mildly hyperdense rounded areas which are too small to characterize which may represent complex cysts. There is mild bilateral renal atrophy. There is no hydronephrosis. The bladder is within normal limits. Stomach/Bowel: There is rectal wall thickening with surrounding inflammation. There is no evidence for pneumatosis, free air or bowel obstruction. Scattered colonic diverticula are present. The appendix is not definitely visualized. Stomach is within normal limits. Vascular/Lymphatic: Aortic atherosclerosis. No enlarged abdominal or pelvic  lymph nodes. Reproductive: Prostate is unremarkable. Other: There is bulging of the anterior abdominal wall. There is a anterior left lower quadrant ventral hernia containing nondilated colon which appears similar to prior study. There is no free fluid or free air. Musculoskeletal: Degenerative changes affect the spine. There is chronic compression deformity of T12. Right hip arthroplasty is present. IMPRESSION: 1. Rectal wall thickening with surrounding inflammation compatible with proctitis. 2. Colonic diverticulosis. 3. Bilateral pleural effusions, right greater than left. 4. Cardiomegaly. 5. Stable left lower quadrant ventral hernia containing  nondilated colon. 6. Aortic atherosclerosis. Aortic Atherosclerosis (ICD10-I70.0). Electronically Signed   By: Greig Pique M.D.   On: 12/07/2023 15:11   DG CHEST PORT 1 VIEW Result Date: 12/07/2023 CLINICAL DATA:  Body aches.  Nausea. EXAM: PORTABLE CHEST 1 VIEW COMPARISON:  08/01/2022. FINDINGS: The heart size and mediastinal contours are unchanged. Left atrial appendage occlusion device is again noted. Aortic atherosclerosis. Small to moderate left-greater-than-right pleural effusions with bibasilar opacities favored to reflect atelectasis, not significantly changed compared to the prior exam. No pneumothorax. No acute osseous abnormality. IMPRESSION: Small to moderate left-greater-than-right pleural effusions with bibasilar opacities favored to reflect atelectasis, not significantly changed compared to the prior exam. Electronically Signed   By: Harrietta Sherry M.D.   On: 12/07/2023 13:40    Microbiology: No results found for this or any previous visit (from the past 240 hours).  Time spent: 40 minutes  Signed: Deliliah Room, MD 12/18/23

## 2024-01-09 NOTE — Plan of Care (Signed)
  Problem: Education: Goal: Knowledge of General Education information will improve Description: Including pain rating scale, medication(s)/side effects and non-pharmacologic comfort measures Outcome: Progressing   Problem: Pain Managment: Goal: General experience of comfort will improve and/or be controlled Outcome: Progressing

## 2024-01-09 NOTE — Progress Notes (Addendum)
 PROGRESS NOTE    Gabriel Ayala  FMW:[id number] DOB: 1936/02/24 DOA: 12/07/2023 PCP: [name], MD   Brief Narrative:   88 y.o. male past medical history significant for neuropathy, chronic HFpEF, atrial fibrillation status post Watchman procedure, severe mitral regurgitation status post MitraClip comes in complaining of generalized weakness, nausea vomiting difficulty voiding that started 3 days prior to admission, in the ED was found to have satting in the 90s, sodium of 129 unremarkable with blood cell count, hemoglobin of 10 proBNP greater than 2000, CT abdomen pelvis showed rectal wall thickening with rectal proctitis bilateral pleural effusion.  He has worsening AKI with minimal urine output so nephrology was consulted on 12/10/23. Poor prognosis and palliative team has been consulted.  Assessment & Plan:  Principal Problem:   Acute on chronic heart failure with preserved ejection fraction (HFpEF) (HCC) Active Problems:   Essential hypertension   Paroxysmal atrial fibrillation (HCC)   Acute urinary retention   Proctitis   Hyponatremia   Pleural effusion, bilateral   Chronic wound   Acute on chronic heart failure with preserved ejection fraction (HFpEF)/bilateral pleural effusion,POA: NYHA III Worsening AKI with hyperkalemia and uremic encephalopathy: likely a combination of contrast (CT abdomen/pelvis on 9/29), IV diuretics and poor oral intake. Chest x-ray showed bilateral pleural effusions with bibasilar opacities with elevated BNP. Positive JVD crackles at bases bilaterally. Patient was started on IV furosemide  every 12 hours, dced now.   Continue strict ins and outs and daily weights. Echocardiogram shows normal LVEF.  He was given 80 mg of IV lasix  on 10/1 around 1.30 pm and UOP is 300 ml since then. No more diuretics. Consulted nephrology. Imaging of the kidneys showed mild b/l renal atrophy and cysts but no hydronephrosis. Started on gentle IVF on 10/2, dced  on the morning of 10/3 because of worsening respiratory status.   Acute respiratory failure with hypoxia Noted to be on 4 to 5 L of oxygen by nasal cannula.  Likely due to volume overload and pleural effusions.  Requiring a NRB at this point.   Dysphagia: He does have a history of esophageal stricture for which he had an EGD in 08/05/2022 with dilation.  Looks like [facility].  According to the report there was slight narrowing at the GE junction and in the upper esophagus just distal to the upper esophageal sphincter muscle.  A dilator was passed which went through easily without any resistance. Speech therapy on board. Recommendation is NPO.   Hypervolemic hyponatremia: Improved Close monitoring   Generalized weakness: PT and OT.   Paroxysmal atrial fibrillation: Status post Watchman device, not on anticoagulation.   Chronic leg wounds: Continue wound care.     Chronic right hip pain: Patient and healthcare power of attorney tell me that he has been having right hip pain for over 5 years.   Questionable proctitis: Appreciated on imaging, no diarrhea afebrile with no leukocytosis follow-up PCP as an outpatient.   Acute urinary retention: Foley placed in the ED, continue Foley strict I's and O's and daily weights    Goals of care Discussed with patient's healthcare power of attorney who is his caregiver as well.  Patient has 2 children one of them is in [place] and the other is in [place] .  Power of attorney mentioned that patient has been doing fairly well and has been independent up until a few weeks ago.  He has chronic hip pain as a result of which his ambulatory status  is poor but he still is able to walk at times.  He bathes himself. So at this time we will proceed with treatment as outlined above and see if he progresses.  If he does not progress then at that time palliative care can be consulted but will hold off for now. He is already a  DNR.  Palliative team consulted.    DVT prophylaxis: enoxaparin (LOVENOX) injection 30 mg Start: 12/09/23 2200     Code Status: Limited: Do not attempt resuscitation (DNR) -DNR-LIMITED -Do Not Intubate/DNI  Comfort care measures Family Communication:  Family at the bedside Status is: Inpatient Remains inpatient appropriate because: encephalopathy, worsening AKI    Subjective:  His condition got worse on the morning of 10/3. He was quite unresponsive and hypoxia worsened. Upon my examination at the bedside, he was obtunded, unable to communicate, and was on NRB with oxygen saturations in the 80s. I called her POA, Candice who is on the way to see him. He is a DNR/DNI and needs to be on comfort care measures. Spoke to patient's nurses at the bedside.   Spoke to patient'S POA, Candice at bedside in length around 8:45 am. Decision was made by her to transition him to comfort care measures.   Examination:  General exam: Obtunded, tachypneic, NRB in place Respiratory system: tachypnea, coarse breath sounds bilaterally Cardiovascular system: tachycardic Gastrointestinal system: Abdomen is nondistended, soft and nontender. No organomegaly or masses felt. Normal bowel sounds heard. Central nervous system: Obtunded Extremities: unable to assess strength due to altered mentation Skin: dry scaly skin over b/l LE with some edema    Wound 12/08/23 1614 Pressure Injury Buttocks Right Stage 3 -  Full thickness tissue loss. Subcutaneous fat may be visible but bone, tendon or muscle are NOT exposed. (Active)     Diet Orders (From admission, onward)     Start     Ordered   12/09/23 1244  Diet NPO time specified Except for: Sips with Meds, Ice Chips  Diet effective now       Comments: Necessary meds crushed in puree  Question Answer Comment  Except for Sips with Meds   Except for Ice Chips      12/09/23 1244            Objective: Vitals:   12/10/23 1311 12/10/23 2005 12-12-23  0613 Dec 12, 2023 0656  BP: (!) 101/46 (!) 92/54 (!) 93/39 123/71  Pulse: 70 74 97 (!) 55  Resp: 14 14 16    Temp: 98 F (36.7 C) 99.2 F (37.3 C) 99 F (37.2 C) 98.9 F (37.2 C)  TempSrc:  Oral Oral Oral  SpO2: 96% 94% 92% 92%  Weight:   65.9 kg   Height:        Intake/Output Summary (Last 24 hours) at 12-12-2023 0752 Last data filed at 12/10/2023 1800 Gross per 24 hour  Intake 3.45 ml  Output --  Net 3.45 ml   Filed Weights   12/09/23 0331 12/10/23 0500 12-Dec-2023 0613  Weight: 66.3 kg 65.5 kg 65.9 kg    Scheduled Meds:  naloxone       Chlorhexidine Gluconate Cloth  6 each Topical Daily   collagenase   Topical Daily   enoxaparin (LOVENOX) injection  30 mg Subcutaneous QHS    HYDROmorphone  (DILAUDID ) injection  1 mg Intravenous Once   Influenza vac split trivalent PF  0.5 mL Intramuscular Tomorrow-1000   naloxone       naLOXone (NARCAN)  injection  2 mg Intravenous  Once   pneumococcal 20-valent conjugate vaccine  0.5 mL Intramuscular Tomorrow-1000   sodium chloride  flush  3 mL Intravenous Q12H   Continuous Infusions:  dextrose 5 % and 0.9 % NaCl Stopped (12-30-2023 1000)    Nutritional status     Body mass index is 20.26 kg/m.  Data Reviewed:   CBC: Recent Labs  Lab 12/07/23 1320 12/08/23 0500 12/09/23 0456 12/10/23 0456 December 30, 2023 0442  WBC 6.2 7.1 7.3 7.0 11.1*  NEUTROABS 4.6  --   --   --   --   HGB 10.3* 10.6* 10.1* 10.9* 11.0*  HCT 33.5* 34.0* 33.8* 37.0* 37.2*  MCV 91.8 93.7 94.2 96.6 98.2  PLT 299 287 260 249 276   Basic Metabolic Panel: Recent Labs  Lab 12/07/23 1320 12/08/23 0500 12/09/23 0456 12/10/23 0456 12/30/2023 0442  NA 129* 129* 130* 133* 134*  K 4.5 4.6 4.9 5.0 5.8*  CL 97* 98 98 99 100  CO2 22 21* 20* 21* 19*  GLUCOSE 81 40* 90 68* 92  BUN 17 19 24* 30* 40*  CREATININE 0.92 1.05 1.84* 3.07* 4.32*  CALCIUM 9.4 8.8* 8.8* 9.2 8.8*   GFR: Estimated Creatinine Clearance: 11.7 mL/min (A) (by C-G formula based on SCr of 4.32 mg/dL  (H)). Liver Function Tests: Recent Labs  Lab 12/07/23 1320  AST 12*  ALT 8  ALKPHOS 113  BILITOT 0.6  PROT 6.2*  ALBUMIN 3.9   Recent Labs  Lab 12/07/23 1320  LIPASE 11   No results for input(s): AMMONIA in the last 168 hours. Coagulation Profile: No results for input(s): INR, PROTIME in the last 168 hours. Cardiac Enzymes: No results for input(s): CKTOTAL, CKMB, CKMBINDEX, TROPONINI in the last 168 hours. BNP (last 3 results) Recent Labs    12/07/23 1320 12/10/23 0456  PROBNP 10,592.0* >35,000.0*   HbA1C: No results for input(s): HGBA1C in the last 72 hours. CBG: Recent Labs  Lab 12/10/23 1838 12/10/23 1957 12/10/23 2359 12/30/23 0404 December 30, 2023 0702  GLUCAP 70 76 87 85 136*   Lipid Profile: No results for input(s): CHOL, HDL, LDLCALC, TRIG, CHOLHDL, LDLDIRECT in the last 72 hours. Thyroid Function Tests: No results for input(s): TSH, T4TOTAL, FREET4, T3FREE, THYROIDAB in the last 72 hours. Anemia Panel: No results for input(s): VITAMINB12, FOLATE, FERRITIN, TIBC, IRON, RETICCTPCT in the last 72 hours. Sepsis Labs: No results for input(s): PROCALCITON, LATICACIDVEN in the last 168 hours.  No results found for this or any previous visit (from the past 240 hours).       Radiology Studies: DG Chest Port 1 View Result Date: 12/30/2023 CLINICAL DATA:  Shortness of breath. EXAM: PORTABLE CHEST 1 VIEW COMPARISON:  12/10/2023 FINDINGS: Rotated low volume film. The cardio pericardial silhouette is enlarged. Bibasilar collapse/consolidation with bilateral moderate pleural effusions. Bones are diffusely demineralized. Telemetry leads overlie the chest. IMPRESSION: Study limited by low lung volumes and substantial patient rotation. Cardiomegaly with bibasilar collapse/consolidation with bilateral moderate pleural effusions. Electronically Signed   By: Camellia Candle M.D.   On: 2023/12/30 07:24   DG CHEST PORT 1  VIEW Result Date: 12/10/2023 EXAM: 1 VIEW(S) XRAY OF THE CHEST 12/10/2023 11:08:00 AM COMPARISON: Chest x-ray 12/07/2023. CLINICAL HISTORY: Heart failure (HCC) [id number]. Heart failure. FINDINGS: LUNGS AND PLEURA: There are small bilateral pleural effusions, right greater than left. Right pleural effusion has increased. There are increasing right mid and lower lung airspace opacities. No pneumothorax. HEART AND MEDIASTINUM: The heart is enlarged, unchanged. BONES AND SOFT TISSUES: No acute fracture. IMPRESSION: 1.  Increasing right mid and lower lung airspace opacities. 2. Small bilateral pleural effusions, increasing on the right. 3. Cardiomegaly, unchanged. Electronically signed by: Greig Pique MD 12/10/2023 04:02 PM EDT RP Workstation: [id number]         LOS: 4 days   Time spent= 47 mins    Deliliah Room, MD [facility]  If 7PM-7AM, please contact night-coverage  12/19/2023, 7:52 AM

## 2024-01-09 NOTE — Progress Notes (Signed)
   12-31-2023 1000  Spiritual Encounters  Type of Visit Initial  Care provided to: Patient;Friend  Conversation partners present during encounter Nurse  Referral source Physician  Reason for visit End-of-life   I responded to a spiritual care consult request by Dr. Jeryl indicating Mr. Harmes appears actively dying.  I visited with [facility], his primary care giver for 31yrs. Candice welcomed my visit and processed with me around her memories and relationship with Ashad, who also lost his spouse 34yrs ago. His daughter is recovering from cancer in [place]. His son is enroute from [employer].  I provided compassionate presence and facilitated Candice's grief process, encouraging her presence at bedside and helping to offer her comfort. At her suggestion I did offer a prayer for Mr. Hiney to honor his life and wish peace at this time of transition.  Please page spiritual care for any needs especially as others may arrive.  Ameena Vesey L. Delores HERO.Div [phone]

## 2024-01-09 NOTE — Progress Notes (Addendum)
     Patient Name: Gabriel Ayala           DOB: 24-Sep-1935  MRN: [id number]      Admission Date: 12/07/2023  Attending Provider: Dino Antu, MD  Primary Diagnosis: Acute on chronic heart failure with preserved ejection fraction (HFpEF) (HCC)   Level of care: Stepdown   OVERNIGHT EVENT   HPI/ Events of Note  Gabriel Ayala, 88 y.o. male, was admitted on 12/07/2023 for Acute on chronic heart failure with preserved ejection fraction (HFpEF) (HCC).   Rapid response: AMS, labored breathing.    Bedside Assessment:  Patient is obtunded.  No response to painful stimuli.  CBG normal.  Narcan given. Initial O2 sats stable, however progressed to desat rapidly--SpO2 60%.  Placed on NRB and [place]. Unable to use Bipap given obtunded state.  Bilateral crackles heard, tachypneic, labored breathing, accessory muscle use noted.  POA updated- confirms DNR/DNI.  Understands that patient is not doing well, but hopes he will bounce back.  Will be at bedside as soon as possible.  She plans to call patient's daughter and son as well to give them updates.  Attending MD handoff report completed.   Plan: Transfer to SDU [facility] Chest xray Narcan Lasix   Lavanda Horns, DNP, ACNPC- AG [facility] [place]

## 2024-01-09 NOTE — Progress Notes (Signed)
 Daily Progress Note   Patient Name: Gabriel Ayala       Date: 12/25/23 DOB: 1936/03/01  Age: 88 y.o. MRN#: [id number] Attending Physician: Dino Antu, MD Primary Care Physician: [name], MD Admit Date: 12/07/2023  Reason for Consultation/Follow-up: Terminal Care  Subjective:  Actively dying Sighing respirations Appears pale Obtunded HCPOA at bedside.   Length of Stay: 4  Current Medications: Scheduled Meds:   Chlorhexidine Gluconate Cloth  6 each Topical Daily   collagenase   Topical Daily    HYDROmorphone  (DILAUDID ) injection  1 mg Intravenous Once   naloxone       naLOXone (NARCAN)  injection  2 mg Intravenous Once   pneumococcal 20-valent conjugate vaccine  0.5 mL Intramuscular Tomorrow-1000   scopolamine  1 patch Transdermal Q72H   sodium chloride  flush  3 mL Intravenous Q12H    Continuous Infusions:  dextrose 5 % and 0.9 % NaCl Stopped (2023-04-19 1000)    PRN Meds: acetaminophen  **OR** acetaminophen , HYDROmorphone  (DILAUDID ) injection, LORazepam , naloxone, naLOXone (NARCAN)  injection, prochlorperazine  Physical Exam         Actively dying Gasping respirations Pale Obtunded Monitor noted  Vital Signs: BP (!) 138/55   Pulse 96   Temp 98.9 F (37.2 C) (Oral)   Resp 20   Ht 5' 11 (1.803 m)   Wt 65.9 kg   SpO2 92%   BMI 20.26 kg/m  SpO2: SpO2: 92 % O2 Device: O2 Device: High Flow Nasal Cannula, NRB O2 Flow Rate: O2 Flow Rate (L/min): 15 L/min  Intake/output summary:  Intake/Output Summary (Last 24 hours) at December 25, 2023 0906 Last data filed at 12/10/2023 1800 Gross per 24 hour  Intake 3.45 ml  Output --  Net 3.45 ml   LBM: Last BM Date : 12/06/23 Baseline Weight: Weight: 66.3 kg Most recent weight: Weight: 65.9 kg        Palliative Assessment/Data:      Patient Active Problem List   Diagnosis Date Noted   Acute on chronic heart failure with preserved ejection fraction (HFpEF) (HCC) 12/07/2023   Proctitis 12/07/2023   Hyponatremia 12/07/2023   Pleural effusion, bilateral 12/07/2023   Chronic wound 12/07/2023   Status post total replacement of right hip 03/07/2020   Gastroesophageal reflux disease without esophagitis 03/07/2020   Chronic kidney disease, stage 4 (severe) (  HCC) 03/07/2020   Spinal stenosis of lumbar region with radiculopathy 08/07/2018   Peripheral neuropathy 08/07/2018   Recurrent falls 08/07/2018   Essential hypertension 08/07/2018   Paroxysmal atrial fibrillation (HCC) 08/07/2018   Acute urinary retention 08/07/2018   Atopic eczema 08/07/2018   Constipation 08/07/2018   Vitamin D deficiency 08/07/2018    Palliative Care Assessment & Plan   Patient Profile:    Assessment:    Recommendations/Plan:  Comfort measures Anticipate in hospital death Discontinue medications and interventions no longer needed IV PRN Dilaudid  for dyspnea/pain Agree with scopolamine patch Spiritual care consult for additional support, discussed with HCPOA at bedside who states that the patient would likely appreciate a prayer.  Comfort cart for HCPOA at bedside.  Prognosis hours to likely not more than 1-2 days at this point.   Less Than 2 Days Before Death In the final hours, patients exhibit specific clinical signs that indicate the approach of death.  Clinical Signs:  1.Death rattle  Management Techniques: Educate the family about the nature of the sound Reposition the patient to promote drainage Consider using anticholinergic medications if the patient is suffering.  2.Apnea. Management Techniques:  Educate the family about the possibility of irregular breathing patterns Administer opioids if dyspnea is present.  3.Respiration with mandibular movement  Management  Techniques: Educate the family about changes in breathing patterns.  4.Decreased urine output  Management Techniques: Educate the family about the natural decrease in bodily functions.  5.Pulselessness of radial artery  Management Techniques: Educate the family about changes in circulation and perfusion.  6.Inability to close eyelids  Management Techniques: Educate the family about the physical changes in muscle control Use a wet washcloth if eyes are dry or irritated.  7.Grunting of vocal cords  Management Techniques: Educate the family the family about the possibility of vocal cord tension Administer opioids if pain is present.  8.Fever  Management Techniques: Place a cool washcloth on the patient's forehead Remove excess blankets Use a fan Administer acetominophin if necessary   Goals of Care and Additional Recommendations: Limitations on Scope of Treatment: Full Comfort Care  Code Status:    Code Status Orders  (From admission, onward)           Start     Ordered   12/07/23 1808  Do not attempt resuscitation (DNR)- Limited -Do Not Intubate (DNI)  Continuous       Question Answer Comment  If pulseless and not breathing No CPR or chest compressions.   In Pre-Arrest Conditions (Patient Is Breathing and Has A Pulse) Do not intubate. Provide all appropriate non-invasive medical interventions. Avoid ICU transfer unless indicated or required.   Consent: Discussion documented in EHR or advanced directives reviewed      12/07/23 1810           Code Status History     Date Active Date Inactive Code Status Order ID Comments User Context   03/17/2020 1402 01/01/2021 1121 DNR [id number]  Cloria Annabella [facility]      Advance Directive Documentation    Flowsheet Row Most Recent Value  Type of Advance Directive Healthcare Power of Attorney  Pre-existing out of facility DNR order (yellow form or pink MOST form) --  MOST Form in Place? --    Prognosis:   Hours - Days  Discharge Planning: Anticipated Hospital Death  Care plan was discussed with  [facility] MD, HCPOA, RN  Thank you for allowing the Palliative Medicine Team to assist in the care of this  patient.  High MDM     Greater than 50%  of this time was spent counseling and coordinating care related to the above assessment and plan.  Lonia Serve, MD  Please contact Palliative Medicine Team phone at [phone] for questions and concerns.

## 2024-01-09 NOTE — Progress Notes (Signed)
 PT Cancellation Note  Patient Details Name: NIMROD WENDT MRN: [id number] DOB: 20-Sep-1935   Cancelled Treatment:     Significant medical decline and transfer to ICU.  Rehab Team to continue to follow.  Katheryn Leap  PTA Acute  Rehabilitation Services Office M-F          [phone]

## 2024-01-09 DEATH — deceased
